# Patient Record
Sex: Female | Born: 1945 | Race: White | Hispanic: Yes | Marital: Single | Smoking: Former smoker
Health system: Southern US, Community
[De-identification: ages and names within clinical notes are randomized; demographics above are authoritative.]

## PROBLEM LIST (undated history)

## (undated) ENCOUNTER — Emergency Department (HOSPITAL_COMMUNITY): Discharge status: Absent without leave | Payer: Medicaid Other

## (undated) DIAGNOSIS — M79606 Pain in leg, unspecified: Secondary | ICD-10-CM

## (undated) DIAGNOSIS — M549 Dorsalgia, unspecified: Secondary | ICD-10-CM

## (undated) DIAGNOSIS — C801 Malignant (primary) neoplasm, unspecified: Secondary | ICD-10-CM

## (undated) DIAGNOSIS — J189 Pneumonia, unspecified organism: Secondary | ICD-10-CM

## (undated) DIAGNOSIS — I959 Hypotension, unspecified: Secondary | ICD-10-CM

## (undated) HISTORY — PX: APPENDECTOMY: SHX54

## (undated) HISTORY — DX: Dorsalgia, unspecified: M54.9

## (undated) HISTORY — PX: OTHER SURGICAL HISTORY: SHX169

---

## 2000-10-17 HISTORY — PX: SMALL INTESTINE SURGERY: SHX150

## 2013-04-02 ENCOUNTER — Encounter (HOSPITAL_COMMUNITY): Payer: Self-pay | Admitting: Emergency Medicine

## 2013-04-02 ENCOUNTER — Emergency Department (INDEPENDENT_AMBULATORY_CARE_PROVIDER_SITE_OTHER)
Admission: EM | Admit: 2013-04-02 | Discharge: 2013-04-02 | Disposition: A | Discharge status: Home / Self Care | Payer: Self-pay | Source: Home / Self Care

## 2013-04-02 DIAGNOSIS — T23099A Burn of unspecified degree of multiple sites of unspecified wrist and hand, initial encounter: Secondary | ICD-10-CM

## 2013-04-02 DIAGNOSIS — T23101A Burn of first degree of right hand, unspecified site, initial encounter: Secondary | ICD-10-CM

## 2013-04-02 HISTORY — DX: Hypotension, unspecified: I95.9

## 2013-04-02 MED ORDER — HYDROCODONE-ACETAMINOPHEN 5-325 MG PO TABS: 1.0000 | ORAL_TABLET | ORAL | Status: DC | PRN

## 2013-04-02 MED ORDER — CLINDAMYCIN HCL 300 MG PO CAPS: 300.0000 mg | ORAL_CAPSULE | Freq: Three times a day (TID) | ORAL | Status: DC

## 2013-04-02 NOTE — ED Provider Notes (Signed)
Medical screening examination/treatment/procedure(s) were performed by resident physician or non-physician practitioner and as supervising physician I was immediately available for consultation/collaboration.   Barkley Bruns MD.   Linna Hoff, MD 04/02/13 [phone]

## 2013-04-02 NOTE — ED Notes (Signed)
Hand burn to right hand on may 11.  Burned with cooking oil.  Has not seen a physician for this.  Pealing skin from left hand and fingers.  Burn included posterior right hand and in between index and middle finger and including thumb

## 2013-04-02 NOTE — ED Provider Notes (Signed)
History     CSN: [id number]  Arrival date & time 04/02/13  1358   None     Chief Complaint  Patient presents with  . Hand Burn    (Consider location/radiation/quality/duration/timing/severity/associated sxs/prior treatment) HPI Comments: And he is 67 year old female is here for evaluation of a burn to the right hand after accidentally spilling hot oil on her hand on 02/24/2013. Is complaining of pain, along with mild swelling and erythema to primarily the dorsum of the hand. Denies systemic symptoms such as fever.   Past Medical History  Diagnosis Date  . Hypotension     History reviewed. No pertinent past surgical history.  No family history on file.  History  Substance Use Topics  . Smoking status: Never Smoker   . Smokeless tobacco: Not on file  . Alcohol Use: No    OB History   Grav Para Term Preterm Abortions TAB SAB Ect Mult Living                  Review of Systems  Constitutional: Negative.  Negative for fever.  Respiratory: Negative.   Cardiovascular: Negative.   Gastrointestinal: Negative.   Musculoskeletal: Positive for joint swelling.  Skin:       As per HPI    Allergies  Review of patient's allergies indicates no known allergies.  Home Medications   Current Outpatient Rx  Name  Route  Sig  Dispense  Refill  . clindamycin (CLEOCIN) 300 MG capsule   Oral   Take 1 capsule (300 mg total) by mouth 3 (three) times daily. X 8 days   24 capsule   0   . HYDROcodone-acetaminophen (NORCO/VICODIN) 5-325 MG per tablet   Oral   Take 1 tablet by mouth every 4 (four) hours as needed for pain.   15 tablet   0     BP 108/50  Pulse 64  Temp(Src) 98.3 F (36.8 C) (Oral)  Resp 16  SpO2 99%  Physical Exam  Constitutional: She is oriented to person, place, and time. She appears well-developed and well-nourished. No distress.  HENT:  Head: Normocephalic and atraumatic.  Eyes: EOM are normal. Pupils are equal, round, and reactive to light.  Neck:  Normal range of motion. Neck supple.  Cardiovascular: Normal rate.   Pulmonary/Chest: Effort normal.  Musculoskeletal: She exhibits edema and tenderness.  Substantial limitation to the flexion of the index and long digits/ IP joints. Mild to moderate swelling persists and marked tenderness.  There is well marginated erythema marking the area of the oil contact running from the 3rd MCP to the base of the thumb and distally. No lymphangitis.   Lymphadenopathy:    She has no cervical adenopathy.  Neurological: She is alert and oriented to person, place, and time. No cranial nerve deficit.  Skin: Skin is warm and dry.  Portions of the area of burn with erythema, no lymphangitis, no open areas, drainage or vessicles. Skin intact.  Psychiatric: She has a normal mood and affect.    ED Course  Procedures (including critical care time)  Labs Reviewed - No data to display No results found.   1. Burn of hand including fingers, right, first degree, initial encounter       MDM  Norco for pain Clindamycin as directed. Call Dr. Merrilee Seashore office tomorrow for appointment.  Dr. Merrilee Seashore office RN contacted for appointment        Hayden Rasmussen, NP 04/02/13 1507

## 2013-04-06 ENCOUNTER — Emergency Department (INDEPENDENT_AMBULATORY_CARE_PROVIDER_SITE_OTHER)
Admission: EM | Admit: 2013-04-06 | Discharge: 2013-04-06 | Disposition: A | Discharge status: Home / Self Care | Payer: Self-pay | Source: Home / Self Care

## 2013-04-06 ENCOUNTER — Encounter (HOSPITAL_COMMUNITY): Payer: Self-pay | Admitting: *Deleted

## 2013-04-06 DIAGNOSIS — M79641 Pain in right hand: Secondary | ICD-10-CM

## 2013-04-06 DIAGNOSIS — M79609 Pain in unspecified limb: Secondary | ICD-10-CM

## 2013-04-06 HISTORY — DX: Malignant (primary) neoplasm, unspecified: C80.1

## 2013-04-06 MED ORDER — OXYCODONE-ACETAMINOPHEN 5-325 MG PO TABS: 1.0000 | ORAL_TABLET | ORAL | Status: DC | PRN

## 2013-04-06 NOTE — ED Notes (Addendum)
Burn to R hand on 5/11.  C/o pain inside her hand over MCP joints of thumb, index and long fingers. Burn has healed but skin still pink and fingers are swollen. Pt. unable to make a fist or hold a cup. Good sensation to tips of fingers. Pt. States she went to the [facility] on [address]. but they did not give her any pain medicine.

## 2013-04-06 NOTE — ED Notes (Signed)
Interpreted for Bianca Rasmussen, NP... Gave instructions to pt in Spanish to f/u w/hand specialist per Onalee Hua... Pt verbalized understanding.

## 2013-04-06 NOTE — ED Provider Notes (Signed)
History     CSN: [id number]  Arrival date & time 04/06/13  1531   None     Chief Complaint  Patient presents with  . Hand Burn    (Consider location/radiation/quality/duration/timing/severity/associated sxs/prior treatment) HPI Comments: This 67 year old female presents with complaints of right hand pain as she did 4 days ago. She accidentally burned her hand on 02/24/2013. She did not seek medical care at that time. She has pain and some contracture of the index and long fingers as well as pain in the distal aspect of the hand. See previous chart for additional description. She presents today for treatment of the hand. The patient states that after some discussion, she is taking her antibiotic that is out of her pain medication. I had obtained an appointment for the with Dr. Merlyn Lot the next day however they affirm that they never received this information. I had written down and handed to the M. personally and advised him of the appointment. There is no change in the appearance of the hand.   Past Medical History  Diagnosis Date  . Hypotension   . Cancer     instestines    Past Surgical History  Procedure Laterality Date  . Small intestine surgery  2002    remove cancer    History reviewed. No pertinent family history.  History  Substance Use Topics  . Smoking status: Never Smoker   . Smokeless tobacco: Not on file  . Alcohol Use: No    OB History   Grav Para Term Preterm Abortions TAB SAB Ect Mult Living                  Review of Systems  All other systems reviewed and are negative.    Allergies  Review of patient's allergies indicates no known allergies.  Home Medications   Current Outpatient Rx  Name  Route  Sig  Dispense  Refill  . clindamycin (CLEOCIN) 300 MG capsule   Oral   Take 1 capsule (300 mg total) by mouth 3 (three) times daily. X 8 days   24 capsule   0   . HYDROcodone-acetaminophen (NORCO/VICODIN) 5-325 MG per tablet   Oral   Take 1  tablet by mouth every 4 (four) hours as needed for pain.   15 tablet   0   . oxyCODONE-acetaminophen (PERCOCET/ROXICET) 5-325 MG per tablet   Oral   Take 1 tablet by mouth every 4 (four) hours as needed for pain.   15 tablet   0     BP 126/77  Pulse 74  Temp(Src) 98.2 F (36.8 C) (Oral)  Resp 17  SpO2 100%  Physical Exam  Nursing note and vitals reviewed. Constitutional: She is oriented to person, place, and time. She appears well-developed and well-nourished. No distress.  Pulmonary/Chest: Effort normal.  Musculoskeletal:  And with lesser edema then 4 days ago. There still tenderness, pain, limited range of motion of the index and long fingers and thumb. No evidence of impaired arterial flow.  Neurological: She is alert and oriented to person, place, and time. She exhibits normal muscle tone.  Skin: Skin is warm and dry.  Psychiatric: She has a normal mood and affect.    ED Course  Procedures (including critical care time)  Labs Reviewed - No data to display No results found.   1. Right hand pain       MDM   Via Ramon,MA the instructions were given to the patient and significant other. They are  to call Dr. Merrilee Seashore office on Monday for an appointment. The name, address and phone number as above. The patient does not recall me giving him information regarding this appointment. I had spent quite a bit of time on the telephone with Dr. Merrilee Seashore office and spoke with the R.N. in the office and eventually obtain an appointment with him the following day as they had a cancellation. I printed on a larger sheet of paper and given it to the interpreter at that time. I told him to make sure they did not miss the appointment. The patient and her significan denies hearing or receiving any of that information., They missed that appointment. [facility] and [facility]  There 4 days ago. Patient is given Percocet for pain #15 and  advised to call Dr. Merrilee Seashore office tomorrow. I told him anything else that we can do for her at this point.        Hayden Rasmussen, NP 04/06/13 1656

## 2013-04-07 NOTE — ED Provider Notes (Signed)
Medical screening examination/treatment/procedure(s) were performed by resident physician or non-physician practitioner and as supervising physician I was immediately available for consultation/collaboration.   KINDL,JAMES DOUGLAS MD.   James D Kindl, MD 04/07/13 1116 

## 2013-04-24 ENCOUNTER — Ambulatory Visit: Payer: Self-pay | Attending: Orthopedic Surgery | Admitting: Occupational Therapy

## 2013-04-24 DIAGNOSIS — M25649 Stiffness of unspecified hand, not elsewhere classified: Secondary | ICD-10-CM | POA: Insufficient documentation

## 2013-04-24 DIAGNOSIS — IMO0001 Reserved for inherently not codable concepts without codable children: Secondary | ICD-10-CM | POA: Insufficient documentation

## 2013-04-24 DIAGNOSIS — R279 Unspecified lack of coordination: Secondary | ICD-10-CM | POA: Insufficient documentation

## 2013-04-24 DIAGNOSIS — M25549 Pain in joints of unspecified hand: Secondary | ICD-10-CM | POA: Insufficient documentation

## 2013-04-29 ENCOUNTER — Ambulatory Visit: Payer: Self-pay | Admitting: Occupational Therapy

## 2013-05-02 ENCOUNTER — Ambulatory Visit: Payer: Self-pay | Admitting: Occupational Therapy

## 2013-05-06 ENCOUNTER — Ambulatory Visit: Payer: Self-pay | Admitting: Occupational Therapy

## 2013-05-08 ENCOUNTER — Encounter: Payer: Self-pay | Admitting: Occupational Therapy

## 2013-05-13 ENCOUNTER — Ambulatory Visit: Payer: Self-pay | Admitting: Occupational Therapy

## 2013-05-15 ENCOUNTER — Encounter: Payer: Self-pay | Admitting: Occupational Therapy

## 2013-05-20 ENCOUNTER — Encounter: Payer: Self-pay | Admitting: Occupational Therapy

## 2013-05-22 ENCOUNTER — Encounter: Payer: Self-pay | Admitting: Occupational Therapy

## 2013-05-24 ENCOUNTER — Ambulatory Visit: Payer: Self-pay | Attending: Family Medicine

## 2013-06-03 ENCOUNTER — Ambulatory Visit: Payer: No Typology Code available for payment source | Attending: Internal Medicine | Admitting: Internal Medicine

## 2013-06-03 VITALS — BP 109/71 | HR 67 | Temp 97.9°F | Resp 16 | Ht <= 58 in | Wt 139.0 lb

## 2013-06-03 DIAGNOSIS — K029 Dental caries, unspecified: Secondary | ICD-10-CM

## 2013-06-03 DIAGNOSIS — Z7189 Other specified counseling: Secondary | ICD-10-CM

## 2013-06-03 DIAGNOSIS — Z7689 Persons encountering health services in other specified circumstances: Secondary | ICD-10-CM

## 2013-06-03 LAB — CMP AND LIVER
ALT: 13 U/L (ref 0–35)
Bilirubin, Direct: 0.1 mg/dL (ref 0.0–0.3)
CO2: 30 mEq/L (ref 19–32)
Calcium: 8.8 mg/dL (ref 8.4–10.5)
Chloride: 106 mEq/L (ref 96–112)
Potassium: 4.2 mEq/L (ref 3.5–5.3)
Sodium: 141 mEq/L (ref 135–145)
Total Protein: 6.7 g/dL (ref 6.0–8.3)

## 2013-06-03 LAB — CBC WITH DIFFERENTIAL/PLATELET
Basophils Absolute: 0 10*3/uL (ref 0.0–0.1)
Basophils Relative: 0 % (ref 0–1)
Eosinophils Absolute: 0.3 10*3/uL (ref 0.0–0.7)
Eosinophils Relative: 3 % (ref 0–5)
Lymphocytes Relative: 28 % (ref 12–46)
MCHC: 32.8 g/dL (ref 30.0–36.0)
MCV: 91.4 fL (ref 78.0–100.0)
Platelets: 264 10*3/uL (ref 150–400)
RDW: 13.8 % (ref 11.5–15.5)
WBC: 8.9 10*3/uL (ref 4.0–10.5)

## 2013-06-03 LAB — LIPID PANEL
Cholesterol: 192 mg/dL (ref 0–200)
LDL Cholesterol: 125 mg/dL — ABNORMAL HIGH (ref 0–99)
Total CHOL/HDL Ratio: 5.8 Ratio
VLDL: 34 mg/dL (ref 0–40)

## 2013-06-03 NOTE — Progress Notes (Signed)
Pt here to establish care for dental problems.states she has not been to dentist in yrs. Pain noted but denies fevers,drainage.vss Spanish interpretor present

## 2013-06-03 NOTE — Progress Notes (Signed)
Patient ID: Bianca Henderson, female   DOB: 03-11-1946, 67 y.o.   MRN: [id number]  CC: To Establish Care  HPI: Patient was seen in the clinic today. She is a 67 years old woman with no significant past medical history. She complains of tooth ache and would like to be referred to a dentist. She denies headache, no chest pain. She has not seen a doctor for years, has not had a mammogram done, no Pap smear done. No colonoscopy. She does not smoke cigarette, she does not drink alcohol.   Allergies  Allergen Reactions  . Septra [Sulfamethoxazole W-Trimethoprim] Itching and Rash   Past Medical History  Diagnosis Date  . Hypotension   . Cancer     instestines   Current Outpatient Prescriptions on File Prior to Visit  Medication Sig Dispense Refill  . clindamycin (CLEOCIN) 300 MG capsule Take 1 capsule (300 mg total) by mouth 3 (three) times daily. X 8 days  24 capsule  0  . HYDROcodone-acetaminophen (NORCO/VICODIN) 5-325 MG per tablet Take 1 tablet by mouth every 4 (four) hours as needed for pain.  15 tablet  0  . oxyCODONE-acetaminophen (PERCOCET/ROXICET) 5-325 MG per tablet Take 1 tablet by mouth every 4 (four) hours as needed for pain.  15 tablet  0   No current facility-administered medications on file prior to visit.   History reviewed. No pertinent family history. History   Social History  . Marital Status: Single    Spouse Name: N/A    Number of Children: N/A  . Years of Education: N/A   Occupational History  . Not on file.   Social History Main Topics  . Smoking status: Never Smoker   . Smokeless tobacco: Not on file  . Alcohol Use: No  . Drug Use: No  . Sexual Activity: Not on file   Other Topics Concern  . Not on file   Social History Narrative  . No narrative on file    Review of Systems: Constitutional: Negative for fever, chills, diaphoresis, activity change, appetite change and fatigue. HENT: Negative for ear pain, nosebleeds, congestion, facial swelling,  rhinorrhea, neck pain, neck stiffness and ear discharge.  Eyes: Negative for pain, discharge, redness, itching and visual disturbance. Respiratory: Negative for cough, choking, chest tightness, shortness of breath, wheezing and stridor.  Cardiovascular: Negative for chest pain, palpitations and leg swelling. Gastrointestinal: Negative for abdominal distention. Genitourinary: Negative for dysuria, urgency, frequency, hematuria, flank pain, decreased urine volume, difficulty urinating and dyspareunia.  Musculoskeletal: Negative for back pain, joint swelling, arthralgias and gait problem. Neurological: Negative for dizziness, tremors, seizures, syncope, facial asymmetry, speech difficulty, weakness, light-headedness, numbness and headaches.  Hematological: Negative for adenopathy. Does not bruise/bleed easily. Psychiatric/Behavioral: Negative for hallucinations, behavioral problems, confusion, dysphoric mood, decreased concentration and agitation.    Objective:   Filed Vitals:   06/03/13 1241  BP: 109/71  Pulse: 67  Temp: 97.9 F (36.6 C)  Resp: 16    Physical Exam: Constitutional: Patient appears well-developed and well-nourished. No distress. HENT: Dental caries.  Eyes: Conjunctivae and EOM are normal. PERRLA, no scleral icterus. Neck: Normal ROM. Neck supple. No JVD. No tracheal deviation. No thyromegaly. CVS: RRR, S1/S2 +, no murmurs, no gallops, no carotid bruit.  Pulmonary: Effort and breath sounds normal, no stridor, rhonchi, wheezes, rales.  Abdominal: Soft. BS +,  no distension, tenderness, rebound or guarding.  Musculoskeletal: Normal range of motion. No edema and no tenderness.  Lymphadenopathy: No lymphadenopathy noted, cervical, inguinal or axillary Neuro: Alert. Normal  reflexes, muscle tone coordination. No cranial nerve deficit. Skin: Skin is warm and dry. No rash noted. Not diaphoretic. No erythema. No pallor. Psychiatric: Normal mood and affect. Behavior, judgment,  thought content normal.  No results found for this basename: WBC, HGB, HCT, MCV, PLT   No results found for this basename: CREATININE, BUN, NA, K, CL, CO2    No results found for this basename: HGBA1C   Lipid Panel  No results found for this basename: chol, trig, hdl, cholhdl, vldl, ldlcalc       Assessment and plan:   Patient Active Problem List   Diagnosis Date Noted  . Encounter to establish care 06/03/2013  . Dental caries 06/03/2013   Ambulatory referral to Dentistry  Labs today: CMP CBCD Urinalysis Lipid Panel HbA1C TSH + FT4 Schedule for Mammogram  Follow up in 4 weeks for annual physical including Pap Smear  Bianca Henderson was given clear instructions to go to ER or return to the clinic if symptoms don't improve, worsen or new problems develop.  Bianca Henderson verbalized understanding.  Bianca Henderson was told to call to get lab results if hasn't heard anything in the next week.    Spanish Interpreter was used to communicate directly with patient for the entire encounter including providing detailed patient instructions.       Jeanann Lewandowsky, MD [facility] [place], [place] [phone]   06/03/2013, 1:13 PM

## 2013-06-04 LAB — URINALYSIS, COMPLETE
Casts: NONE SEEN
Crystals: NONE SEEN
Glucose, UA: NEGATIVE mg/dL
Hgb urine dipstick: NEGATIVE
Ketones, ur: NEGATIVE mg/dL
pH: 8 (ref 5.0–8.0)

## 2013-06-07 ENCOUNTER — Encounter: Payer: Self-pay | Admitting: Internal Medicine

## 2013-06-11 ENCOUNTER — Ambulatory Visit: Payer: No Typology Code available for payment source | Attending: Internal Medicine | Admitting: Internal Medicine

## 2013-06-11 ENCOUNTER — Encounter: Payer: Self-pay | Admitting: Internal Medicine

## 2013-06-11 MED ORDER — DOXYCYCLINE HYCLATE 100 MG PO TABS: 100.0000 mg | ORAL_TABLET | Freq: Two times a day (BID) | ORAL | Status: DC

## 2013-06-11 MED ORDER — TRAMADOL HCL 50 MG PO TABS: 50.0000 mg | ORAL_TABLET | Freq: Three times a day (TID) | ORAL | Status: DC | PRN

## 2013-06-25 ENCOUNTER — Ambulatory Visit: Payer: No Typology Code available for payment source

## 2013-07-11 ENCOUNTER — Ambulatory Visit: Payer: No Typology Code available for payment source | Attending: Internal Medicine | Admitting: Family Medicine

## 2013-07-11 ENCOUNTER — Encounter: Payer: Self-pay | Admitting: Family Medicine

## 2013-07-11 VITALS — BP 119/86 | HR 67 | Temp 97.8°F | Wt 139.8 lb

## 2013-07-11 DIAGNOSIS — G5601 Carpal tunnel syndrome, right upper limb: Secondary | ICD-10-CM

## 2013-07-11 DIAGNOSIS — G5600 Carpal tunnel syndrome, unspecified upper limb: Secondary | ICD-10-CM

## 2013-07-11 NOTE — Patient Instructions (Signed)
Sndrome del tnel carpiano   (Carpal Tunnel Syndrome)   El tnel carpiano es un rea que se encuentra debajo la piel de la palma de su mano. A travs del tnel pasan nervios, vasos sanguneos y tejidos resistentes (tendones). El tnel puede inflamarse (hincharse). Si esto ocurre, un nervio puede quedar pellizcado en la mueca. Esto es lo que causa el sndrome del tnel carpiano.   CUIDADOS EN EL HOGAR    Tome los medicamentos como le indic el mdico.   Si le indicaron un cabestrillo, selo segn las indicaciones. selos a la noche o cuando su mdico le indique.   Haga descansar la mueca de la actividad que le produce dolor.   Ponga hielo en la mueca despus de largos perodos de actividad de la misma.   Ponga el hielo en una bolsa plstica.   Colquese una toalla entre la piel y la bolsa de hielo.   Deje el hielo durante 15 a 20 minutos, 3 a 4 veces por da.   Cumpla con los controles mdicos segn las indicaciones.  SOLICITE AYUDA DE INMEDIATO SI:    Tiene nuevos sntomas que no puede explicar.   Siente un dolor ms intenso y los medicamentos no hacen efecto.  ASEGRESE DE QUE:    Comprende estas instrucciones.   Controlar su enfermedad.   Solicitar ayuda de inmediato si no mejora o si empeora.  Document Released: 09/22/2011 Document Revised: 12/26/2011  [employer] Patient Information 2014 [employer].

## 2013-07-11 NOTE — Progress Notes (Signed)
Patient ID: Bianca Henderson, female   DOB: 27-Jul-1946, 67 y.o.   MRN: [id number]  CC: Pain in right wrist  Interpreter used to communicate directly with patient for entire encounter including providing detailed patient instructions  HPI: The patient is reporting that she's been having problems for the past several months with pain in the right wrist.  She reports that she's had some physical therapy to try and alleviate some of the pain.  It involves fingers one through 3.  The patient reports that she has a tingling and numbness at the tips of the fingers.  She also has pain in the right wrist.  It is exacerbated when she is using her wrist more and using her right hand more frequently with working.  She is right-handed.  She reports that she has also had some weakness in the right hand, mostly because of pain.  The patient reports that the folliculitis that she was treated for a recently has completely resolved.  Allergies  Allergen Reactions  . Septra [Sulfamethoxazole W-Trimethoprim] Itching and Rash   Past Medical History  Diagnosis Date  . Hypotension   . Cancer     instestines   Current Outpatient Prescriptions on File Prior to Visit  Medication Sig Dispense Refill  . clindamycin (CLEOCIN) 300 MG capsule Take 1 capsule (300 mg total) by mouth 3 (three) times daily. X 8 days  24 capsule  0  . doxycycline (VIBRA-TABS) 100 MG tablet Take 1 tablet (100 mg total) by mouth 2 (two) times daily.  20 tablet  0   No current facility-administered medications on file prior to visit.   No family history on file. History   Social History  . Marital Status: Single    Spouse Name: N/A    Number of Children: N/A  . Years of Education: N/A   Occupational History  . Not on file.   Social History Main Topics  . Smoking status: Never Smoker   . Smokeless tobacco: Not on file  . Alcohol Use: No  . Drug Use: No  . Sexual Activity: Not on file   Other Topics Concern  . Not on file   Social  History Narrative  . No narrative on file    Review of Systems  Constitutional: Negative for fever, chills, diaphoresis, activity change, appetite change and fatigue.  HENT: Negative for ear pain, nosebleeds, congestion, facial swelling, rhinorrhea, neck pain, neck stiffness and ear discharge.   Eyes: Negative for pain, discharge, redness, itching and visual disturbance.  Respiratory: Negative for cough, choking, chest tightness, shortness of breath, wheezing and stridor.   Cardiovascular: Negative for chest pain, palpitations and leg swelling.  Gastrointestinal: Negative for abdominal distention.  Genitourinary: Negative for dysuria, urgency, frequency, hematuria, flank pain, decreased urine volume, difficulty urinating and dyspareunia.  Musculoskeletal: Right wrist pain  Neurological: Negative for dizziness, tremors, seizures, syncope, facial asymmetry, speech difficulty, weakness, light-headedness, numbness and headaches.  Hematological: Negative for adenopathy. Does not bruise/bleed easily.  Psychiatric/Behavioral: Negative for hallucinations, behavioral problems, confusion, dysphoric mood, decreased concentration and agitation.    Objective:   Filed Vitals:   07/11/13 1619  BP: 119/86  Pulse: 67  Temp: 97.8 F (36.6 C)    Physical Exam  Constitutional: Appears well-developed and well-nourished. No distress.  HENT: Normocephalic. External right and left ear normal. Oropharynx is clear and moist.  Eyes: Conjunctivae and EOM are normal. PERRLA, no scleral icterus.  Neck: Normal ROM. Neck supple. No JVD. No tracheal deviation. No thyromegaly.  CVS: RRR, S1/S2 +, no murmurs, no gallops, no carotid bruit.  Pulmonary: Effort and breath sounds normal, no stridor, rhonchi, wheezes, rales.  Abdominal: Soft. BS +,  no distension, tenderness, rebound or guarding.  Musculoskeletal: Normal range of motion. No edema, painful right wrist and positive Tinel sign  Lymphadenopathy: No  lymphadenopathy noted, cervical, inguinal. Neuro: Alert. Normal reflexes, muscle tone coordination. No cranial nerve deficit. Skin: Skin is warm and dry. No rash noted. Not diaphoretic. No erythema. No pallor.  Psychiatric: Normal mood and affect. Behavior, judgment, thought content normal.   Lab Results  Component Value Date   WBC 8.9 06/03/2013   HGB 13.5 06/03/2013   HCT 41.2 06/03/2013   MCV 91.4 06/03/2013   PLT 264 06/03/2013   Lab Results  Component Value Date   CREATININE 0.68 06/03/2013   BUN 11 06/03/2013   NA 141 06/03/2013   K 4.2 06/03/2013   CL 106 06/03/2013   CO2 30 06/03/2013    Lab Results  Component Value Date   HGBA1C 5.7* 06/03/2013   Lipid Panel     Component Value Date/Time   CHOL 192 06/03/2013 1317   TRIG 171* 06/03/2013 1317   HDL 33* 06/03/2013 1317   CHOLHDL 5.8 06/03/2013 1317   VLDL 34 06/03/2013 1317   LDLCALC 125* 06/03/2013 1317     Assessment and plan:   Patient Active Problem List   Diagnosis Date Noted  . Encounter to establish care 06/03/2013  . Dental caries 06/03/2013  Carpal tunnel syndrome, right   I encouraged patient to start using a carpal tunnel splint to the right wrist in the evening time.  I like for her to start doing this regularly and followup in one month.  At that time if she continues to have symptoms or if her symptoms worsen I like for her to see sports medicine orthopedics for further evaluation.  The patient verbalized understanding.  Return to clinic in one month for recheck and for complete physical examination  Rodney Langton, MD, CDE, FAAFP [facility] [place], [place]

## 2013-09-05 ENCOUNTER — Ambulatory Visit: Payer: No Typology Code available for payment source | Admitting: Internal Medicine

## 2013-11-04 ENCOUNTER — Ambulatory Visit: Payer: No Typology Code available for payment source | Attending: Internal Medicine | Admitting: Internal Medicine

## 2013-11-04 ENCOUNTER — Encounter: Payer: Self-pay | Admitting: Internal Medicine

## 2013-11-04 VITALS — BP 108/73 | HR 65 | Temp 98.5°F | Resp 16 | Ht 59.0 in | Wt 139.0 lb

## 2013-11-04 DIAGNOSIS — Z01419 Encounter for gynecological examination (general) (routine) without abnormal findings: Secondary | ICD-10-CM | POA: Insufficient documentation

## 2013-11-04 DIAGNOSIS — E785 Hyperlipidemia, unspecified: Secondary | ICD-10-CM

## 2013-11-04 DIAGNOSIS — Z124 Encounter for screening for malignant neoplasm of cervix: Secondary | ICD-10-CM

## 2013-11-04 LAB — POCT GLYCOSYLATED HEMOGLOBIN (HGB A1C): HEMOGLOBIN A1C: 5.4

## 2013-11-04 NOTE — Progress Notes (Signed)
Pt here for pap smear Denies problems or concerns at this time Spanish interpretor present Pt given gown

## 2013-11-04 NOTE — Progress Notes (Signed)
Patient ID: Houston Surges, female   DOB: 14-Jan-1946, 68 y.o.   MRN: [id number] Patient Demographics  Bianca Henderson, is a 68 y.o. female  WUJ:[id number]  [id number]  DOB - 1946-04-17  Chief Complaint  Patient presents with  . Follow-up  . Annual Exam    pap smear        Subjective:   Bianca Henderson is a 68 y.o. female here today for a follow up visit. Patient has no significant complaint. She is here to have a Pap smear done. Awaiting mammogram. She's not on any medication, following exercise and nutritional control for prediabetes and her blood pressure. Blood pressure is controlled. She does not smoke cigarette Patient has No headache, No chest pain, No abdominal pain - No Nausea, No new weakness tingling or numbness, No Cough - SOB.  ALLERGIES: Allergies  Allergen Reactions  . Septra [Sulfamethoxazole-Trimethoprim] Itching and Rash    PAST MEDICAL HISTORY: Past Medical History  Diagnosis Date  . Hypotension   . Cancer     instestines    MEDICATIONS AT HOME: Prior to Admission medications   Medication Sig Start Date End Date Taking? Authorizing Provider  clindamycin (CLEOCIN) 300 MG capsule Take 1 capsule (300 mg total) by mouth 3 (three) times daily. X 8 days 04/02/13   Janne Napoleon, NP  doxycycline (VIBRA-TABS) 100 MG tablet Take 1 tablet (100 mg total) by mouth 2 (two) times daily. 06/11/13   Reyne Dumas, MD     Objective:   Filed Vitals:   11/04/13 1717  BP: 108/73  Pulse: 65  Temp: 98.5 F (36.9 C)  TempSrc: Oral  Resp: 16  Height: 4\' 11"  (1.499 m)  Weight: 139 lb (63.05 kg)  SpO2: 97%    Exam General appearance : Awake, alert, not in any distress. Speech Clear. Not toxic looking HEENT: Atraumatic and Normocephalic, pupils equally reactive to light and accomodation Neck: supple, no JVD. No cervical lymphadenopathy.  Chest:Good air entry bilaterally, no added sounds  CVS: S1 S2 regular, no murmurs.  Abdomen: Bowel sounds present, Non tender and not distended  with no gaurding, rigidity or rebound. Extremities: B/L Lower Ext shows no edema, both legs are warm to touch Neurology: Awake alert, and oriented X 3, CN II-XII intact, Non focal Skin:No Rash Wounds:N/A Pelvic examination: Normal female external genitalia Central cervix, negative cervical motion tenderness, minimal bleeding after Pap smear  Data Review   CBC No results found for this basename: WBC, HGB, HCT, PLT, MCV, MCH, MCHC, RDW, NEUTRABS, LYMPHSABS, MONOABS, EOSABS, BASOSABS, BANDABS, BANDSABD,  in the last 168 hours  Chemistries   No results found for this basename: NA, K, CL, CO2, GLUCOSE, BUN, CREATININE, GFRCGP, CALCIUM, MG, AST, ALT, ALKPHOS, BILITOT,  in the last 168 hours ------------------------------------------------------------------------------------------------------------------ No results found for this basename: HGBA1C,  in the last 72 hours ------------------------------------------------------------------------------------------------------------------ No results found for this basename: CHOL, HDL, LDLCALC, TRIG, CHOLHDL, LDLDIRECT,  in the last 72 hours ------------------------------------------------------------------------------------------------------------------ No results found for this basename: TSH, T4TOTAL, FREET3, T3FREE, THYROIDAB,  in the last 72 hours ------------------------------------------------------------------------------------------------------------------ No results found for this basename: VITAMINB12, FOLATE, FERRITIN, TIBC, IRON, RETICCTPCT,  in the last 72 hours  Coagulation profile  No results found for this basename: INR, PROTIME,  in the last 168 hours    Assessment & Plan   1. Pap smear for cervical cancer screening Pap smear done and sent for cytology - Cytology - PAP - MM Digital Screening; Future  2. Dyslipidemia  - Lipid Panel - HgB A1c  Patient was Counseled extensively about nutrition and exercise  Follow up in 3  months or when necessary  Interpreter was used to communicate directly with patient for the entire encounter including providing detailed patient instructions.   The patient was given clear instructions to go to ER or return to medical center if symptoms don't improve, worsen or new problems develop. The patient verbalized understanding. The patient was told to call to get lab results if they haven't heard anything in the next week.    Angelica Chessman, MD, [place], [place], [place] and [place], [place]   11/04/2013, 5:45 PM

## 2013-11-05 ENCOUNTER — Telehealth: Payer: Self-pay | Admitting: *Deleted

## 2013-11-05 LAB — LIPID PANEL
CHOL/HDL RATIO: 4.7 ratio
Cholesterol: 194 mg/dL (ref 0–200)
HDL: 41 mg/dL (ref >39)
LDL CALC: 118 mg/dL — AB (ref 0–99)
Triglycerides: 177 mg/dL — ABNORMAL HIGH (ref <150)
VLDL: 35 mg/dL (ref 0–40)

## 2013-11-05 NOTE — Telephone Encounter (Signed)
Left message 1st attempt.  

## 2013-11-05 NOTE — Telephone Encounter (Signed)
Message copied by Joan Mayans on Tue Nov 05, 2013  2:44 PM ------      Message from: Tresa Garter      Created: Tue Nov 05, 2013  9:55 AM       Please call to inform patient that her cholesterol level is slightly better than last time at her hemoglobin A1c is normal meaning that she is not diabetic. We encouraged her to continue low-fat diet and regular physical exercise. We are awaiting the Pap smear result ------

## 2013-11-11 ENCOUNTER — Telehealth: Payer: Self-pay | Admitting: *Deleted

## 2013-11-11 NOTE — Telephone Encounter (Signed)
I spoke to the pt and informed her that the PAP results came back normal.

## 2013-11-11 NOTE — Telephone Encounter (Signed)
Message copied by Joan Mayans on Mon Nov 11, 2013 11:27 AM ------      Message from: Angelica Chessman E      Created: Wed Nov 06, 2013  2:47 PM       Please inform patient that her Pap smear result is normal ------

## 2013-11-18 ENCOUNTER — Emergency Department (HOSPITAL_COMMUNITY)
Admission: EM | Admit: 2013-11-18 | Discharge: 2013-11-18 | Disposition: A | Discharge status: Home / Self Care | Payer: Medicaid Other | Attending: Emergency Medicine | Admitting: Emergency Medicine

## 2013-11-18 ENCOUNTER — Emergency Department (HOSPITAL_COMMUNITY): Payer: Medicaid Other

## 2013-11-18 ENCOUNTER — Encounter (HOSPITAL_COMMUNITY): Payer: Self-pay | Admitting: Emergency Medicine

## 2013-11-18 DIAGNOSIS — Z8679 Personal history of other diseases of the circulatory system: Secondary | ICD-10-CM | POA: Insufficient documentation

## 2013-11-18 DIAGNOSIS — Z8589 Personal history of malignant neoplasm of other organs and systems: Secondary | ICD-10-CM | POA: Insufficient documentation

## 2013-11-18 DIAGNOSIS — R109 Unspecified abdominal pain: Secondary | ICD-10-CM | POA: Insufficient documentation

## 2013-11-18 DIAGNOSIS — M25551 Pain in right hip: Secondary | ICD-10-CM

## 2013-11-18 DIAGNOSIS — K8020 Calculus of gallbladder without cholecystitis without obstruction: Secondary | ICD-10-CM

## 2013-11-18 LAB — COMPREHENSIVE METABOLIC PANEL
ALT: 15 U/L (ref 0–35)
AST: 21 U/L (ref 0–37)
Albumin: 3.5 g/dL (ref 3.5–5.2)
Alkaline Phosphatase: 75 U/L (ref 39–117)
BUN: 17 mg/dL (ref 6–23)
CALCIUM: 8.5 mg/dL (ref 8.4–10.5)
CHLORIDE: 103 meq/L (ref 96–112)
CO2: 23 meq/L (ref 19–32)
Creatinine, Ser: 0.8 mg/dL (ref 0.50–1.10)
GFR calc Af Amer: 87 mL/min — ABNORMAL LOW (ref >90)
GFR, EST NON AFRICAN AMERICAN: 75 mL/min — AB (ref >90)
Glucose, Bld: 89 mg/dL (ref 70–99)
Potassium: 4.1 mEq/L (ref 3.7–5.3)
SODIUM: 141 meq/L (ref 137–147)
Total Bilirubin: 0.3 mg/dL (ref 0.3–1.2)
Total Protein: 7.4 g/dL (ref 6.0–8.3)

## 2013-11-18 LAB — URINALYSIS, ROUTINE W REFLEX MICROSCOPIC
Glucose, UA: NEGATIVE mg/dL
Ketones, ur: 15 mg/dL — AB
Nitrite: NEGATIVE
Protein, ur: NEGATIVE mg/dL
SPECIFIC GRAVITY, URINE: 1.035 — AB (ref 1.005–1.030)
UROBILINOGEN UA: 1 mg/dL (ref 0.0–1.0)
pH: 5.5 (ref 5.0–8.0)

## 2013-11-18 LAB — CBC WITH DIFFERENTIAL/PLATELET
BASOS ABS: 0 10*3/uL (ref 0.0–0.1)
Basophils Relative: 0 % (ref 0–1)
EOS PCT: 1 % (ref 0–5)
Eosinophils Absolute: 0.1 10*3/uL (ref 0.0–0.7)
HCT: 39.6 % (ref 36.0–46.0)
Hemoglobin: 13.9 g/dL (ref 12.0–15.0)
LYMPHS PCT: 29 % (ref 12–46)
Lymphs Abs: 2.3 10*3/uL (ref 0.7–4.0)
MCH: 31.6 pg (ref 26.0–34.0)
MCHC: 35.1 g/dL (ref 30.0–36.0)
MCV: 90 fL (ref 78.0–100.0)
Monocytes Absolute: 0.9 10*3/uL (ref 0.1–1.0)
Monocytes Relative: 12 % (ref 3–12)
NEUTROS ABS: 4.4 10*3/uL (ref 1.7–7.7)
NEUTROS PCT: 58 % (ref 43–77)
PLATELETS: 213 10*3/uL (ref 150–400)
RBC: 4.4 MIL/uL (ref 3.87–5.11)
RDW: 13.2 % (ref 11.5–15.5)
WBC: 7.7 10*3/uL (ref 4.0–10.5)

## 2013-11-18 LAB — URINE MICROSCOPIC-ADD ON

## 2013-11-18 MED ORDER — OXYCODONE-ACETAMINOPHEN 5-325 MG PO TABS: 2.0000 | ORAL_TABLET | ORAL | Status: DC | PRN

## 2013-11-18 MED ORDER — IBUPROFEN 400 MG PO TABS: 400.0000 mg | ORAL_TABLET | Freq: Four times a day (QID) | ORAL | Status: DC | PRN

## 2013-11-18 NOTE — ED Provider Notes (Signed)
CSN: [id number]     Arrival date & time 11/18/13  1415 History   First MD Initiated Contact with Patient 11/18/13 1644     Chief Complaint  Patient presents with  . Abdominal Pain   (Consider location/radiation/quality/duration/timing/severity/associated sxs/prior Treatment) Patient is a 68 y.o. female presenting with abdominal pain. The history is provided by the patient and a relative.  Abdominal Pain  patient here complaining of intermittent sharp right flank and inguinal pain x3 months. Episodes of this last for seconds to minutes. They are not worse with movement. No associated hematuria or dysuria. No fever or chills. No vomiting or diarrhea. No treatment used prior to arrival. Symptoms are not worse with walking. No radicular component to them. No rashes to the skin. She is currently pain-free.  Past Medical History  Diagnosis Date  . Hypotension   . Cancer     instestines   Past Surgical History  Procedure Laterality Date  . Small intestine surgery  2002    remove cancer  . Appendectomy     History reviewed. No pertinent family history. History  Substance Use Topics  . Smoking status: Never Smoker   . Smokeless tobacco: Not on file  . Alcohol Use: No   OB History   Grav Para Term Preterm Abortions TAB SAB Ect Mult Living                 Review of Systems  Gastrointestinal: Positive for abdominal pain.  All other systems reviewed and are negative.    Allergies  Septra  Home Medications   Current Outpatient Rx  Name  Route  Sig  Dispense  Refill  . acetaminophen (TYLENOL) 500 MG tablet   Oral   Take 1,000 mg by mouth every 8 (eight) hours as needed for moderate pain.          BP 114/64  Pulse 66  Temp(Src) 97.6 F (36.4 C) (Oral)  Resp 20  Ht 4\' 11"  (1.499 m)  Wt 138 lb (62.596 kg)  BMI 27.86 kg/m2  SpO2 97% Physical Exam  Nursing note and vitals reviewed. Constitutional: She is oriented to person, place, and time. She appears well-developed and  well-nourished.  Non-toxic appearance. No distress.  HENT:  Head: Normocephalic and atraumatic.  Eyes: Conjunctivae, EOM and lids are normal. Pupils are equal, round, and reactive to light.  Neck: Normal range of motion. Neck supple. No tracheal deviation present. No mass present.  Cardiovascular: Normal rate, regular rhythm and normal heart sounds.  Exam reveals no gallop.   No murmur heard. Pulmonary/Chest: Effort normal and breath sounds normal. No stridor. No respiratory distress. She has no decreased breath sounds. She has no wheezes. She has no rhonchi. She has no rales.  Abdominal: Soft. Normal appearance and bowel sounds are normal. She exhibits no distension. There is no tenderness. There is no rigidity, no rebound, no guarding and no CVA tenderness. No hernia. Hernia confirmed negative in the ventral area.    Musculoskeletal: Normal range of motion. She exhibits no edema and no tenderness.  Neurological: She is alert and oriented to person, place, and time. She has normal strength. No cranial nerve deficit or sensory deficit. GCS eye subscore is 4. GCS verbal subscore is 5. GCS motor subscore is 6.  Skin: Skin is warm and dry. No abrasion and no rash noted.  Psychiatric: She has a normal mood and affect. Her speech is normal and behavior is normal.    ED Course  Procedures (including critical  care time) Labs Review Labs Reviewed  COMPREHENSIVE METABOLIC PANEL - Abnormal; Notable for the following:    GFR calc non Af Amer 75 (*)    GFR calc Af Amer 87 (*)    All other components within normal limits  CBC WITH DIFFERENTIAL  URINALYSIS, ROUTINE W REFLEX MICROSCOPIC   Imaging Review No results found.  EKG Interpretation   None       MDM  No diagnosis found. Patient's x-ray findings reviewed with her and she has no evidence of cholecystitis at this time. She has no right upper quadrant pain. Her pain is pinpoint tender at her right inguinal canal without evidence of  hernia. Patient given strict return precautions concerning her cholelithiasis and she understands this. Have offered her an ultrasound today which he has deferred. Followup given    Leota Jacobsen, MD 11/18/13 Dorthula Perfect

## 2013-11-18 NOTE — ED Notes (Signed)
Pt states that when she is walking she gets a pinch in the right hip. Almost like a pin feeling and it catches.

## 2013-11-18 NOTE — Discharge Instructions (Signed)
Return here at once for fever, vomiting, severe abdominal pain.  Colelitiasis    (Cholelithiasis) La colelitiasis (tambin llamada clculos en la vescula) es una enfermedad en la que se forman piedras en la vescula. La vescula es un rgano que almacena la bilis que se forma en el hgado y que ayuda a Licensed conveyancer. Los clculos comienzan como pequeos cristales y lentamente se transforman en piedras. El dolor en la vescula ocurre cuando se producen espasmos y los clculos obstruyen el conducto. El dolor tambin se produce cuando una piedra sale por el conducto.  FACTORES DE RIESGO  Ser mujer.   Tener embarazos mltiples. Algunas veces los mdicos aconsejan extirpar los clculos biliares antes de futuros embarazos.   Ser obeso.  Dietas que incluyan comidas fritas y grasas.   Ser mayor de 14 aos y el aumento de la edad.   El uso prolongado de medicamentos que contengan hormonas femeninas.   Tener diabetes mellitus.   Prdida rpida de peso.   Historia familiar de clculos (herencia).  SNTOMAS  Nuseas.   Vmitos.  Dolor abdominal.   Piel amarilla (ictericia)   Dolor sbito. Puede persistir desde algunos minutos hasta algunas horas.  Cristy Hilts.   Sensibilidad al tacto. En algunos casos, cuando los clculos biliares no se mueven hacia el conducto biliar, las personas no sienten dolor ni presentan sntomas. Estos se denominan clculos "silenciosos".  TRATAMIENTO Los clculos silenciosos no requieren Clinical research associate. En los [employer], podr ser [place]. Las opciones de tratamiento son:  Dwaine Gale para extirpar la vescula. Es el tratamiento ms frecuente.  Medicamentos. No siempre dan resultado y pueden demorar entre 6 y 19 meses o ms en Chief of Staff.  Tratamiento con ondas de choque (litotricia biliar extracorporal). En este tratamiento, una mquina de ultrasonido enva ondas de choque a la vescula para destruir los clculos en  pequeos fragmentos que luego podrn pasar a los intestinos o ser disueltas con medicamentos. INSTRUCCIONES PARA EL CUIDADO EN EL HOGAR   Slo tome medicamentos de venta libre o recetados para Glass blower/designer, Health and safety inspector o bajar la fiebre, segn las indicaciones de su mdico.   Siga una dieta baja en grasas hasta que su mdico lo vea nuevamente. Las grasas hacen que la vescula se Location manager, lo que puede Orthoptist.   Concurra a las consultas de control con su mdico segn las indicaciones. Los ataques casi siempre son recurrentes y generalmente habr que someterse a una ciruga como [place].  SOLICITE ATENCIN MDICA DE INMEDIATO SI:   El dolor aumenta y no puede controlarlo con los medicamentos.   Tiene fiebre o sntomas persistentes durante ms de 2 - 3 das.   Tiene fiebre y los sntomas empeoran repentinamente.   Tiene nuseas o vmitos persistentes.  ASEGRESE DE QUE:   Comprende estas instrucciones.  Controlar su afeccin.  Recibir ayuda de inmediato si no mejora o si empeora. Document Released: 07/20/2006 Document Revised: 06/05/2013 [facility] Patient Information 2014 [place], [place].

## 2013-11-18 NOTE — ED Notes (Signed)
Pt given d/c instructions and verbalized understanding.  

## 2013-11-18 NOTE — ED Notes (Signed)
Pt reports pain in right flank and groin area, onset today around 1000, sudden and shooting pains. Denies other symptoms.

## 2013-11-18 NOTE — ED Notes (Signed)
Patient was informed of their wait time and reassessed. Patient was in no acute distress.

## 2013-11-19 LAB — URINE CULTURE
Colony Count: NO GROWTH
Culture: NO GROWTH

## 2013-11-24 ENCOUNTER — Emergency Department (HOSPITAL_COMMUNITY): Payer: Medicaid Other

## 2013-11-24 ENCOUNTER — Inpatient Hospital Stay (HOSPITAL_COMMUNITY)
Admission: EM | Admit: 2013-11-24 | Discharge: 2013-11-27 | DRG: 194 | Disposition: A | Discharge status: Home / Self Care | Payer: Medicaid Other | Attending: Internal Medicine | Admitting: Internal Medicine

## 2013-11-24 ENCOUNTER — Encounter (HOSPITAL_COMMUNITY): Payer: Self-pay | Admitting: Emergency Medicine

## 2013-11-24 DIAGNOSIS — Z888 Allergy status to other drugs, medicaments and biological substances status: Secondary | ICD-10-CM

## 2013-11-24 DIAGNOSIS — E785 Hyperlipidemia, unspecified: Secondary | ICD-10-CM

## 2013-11-24 DIAGNOSIS — Z8509 Personal history of malignant neoplasm of other digestive organs: Secondary | ICD-10-CM

## 2013-11-24 DIAGNOSIS — D72829 Elevated white blood cell count, unspecified: Secondary | ICD-10-CM

## 2013-11-24 DIAGNOSIS — G5600 Carpal tunnel syndrome, unspecified upper limb: Secondary | ICD-10-CM

## 2013-11-24 DIAGNOSIS — Z7689 Persons encountering health services in other specified circumstances: Secondary | ICD-10-CM

## 2013-11-24 DIAGNOSIS — E871 Hypo-osmolality and hyponatremia: Secondary | ICD-10-CM

## 2013-11-24 DIAGNOSIS — Z9089 Acquired absence of other organs: Secondary | ICD-10-CM

## 2013-11-24 DIAGNOSIS — K029 Dental caries, unspecified: Secondary | ICD-10-CM

## 2013-11-24 DIAGNOSIS — Z124 Encounter for screening for malignant neoplasm of cervix: Secondary | ICD-10-CM

## 2013-11-24 DIAGNOSIS — J189 Pneumonia, unspecified organism: Principal | ICD-10-CM

## 2013-11-24 HISTORY — DX: Pneumonia, unspecified organism: J18.9

## 2013-11-24 HISTORY — DX: Pain in leg, unspecified: M79.606

## 2013-11-24 LAB — BASIC METABOLIC PANEL
BUN: 14 mg/dL (ref 6–23)
CALCIUM: 8.7 mg/dL (ref 8.4–10.5)
CO2: 21 meq/L (ref 19–32)
CREATININE: 0.78 mg/dL (ref 0.50–1.10)
Chloride: 97 mEq/L (ref 96–112)
GFR calc Af Amer: >90 mL/min (ref >90)
GFR calc non Af Amer: 85 mL/min — ABNORMAL LOW (ref >90)
GLUCOSE: 97 mg/dL (ref 70–99)
Potassium: 4.2 mEq/L (ref 3.7–5.3)
Sodium: 131 mEq/L — ABNORMAL LOW (ref 137–147)

## 2013-11-24 LAB — CBC
HEMATOCRIT: 38.7 % (ref 36.0–46.0)
HEMOGLOBIN: 13.4 g/dL (ref 12.0–15.0)
MCH: 31.3 pg (ref 26.0–34.0)
MCHC: 34.6 g/dL (ref 30.0–36.0)
MCV: 90.4 fL (ref 78.0–100.0)
Platelets: 380 10*3/uL (ref 150–400)
RBC: 4.28 MIL/uL (ref 3.87–5.11)
RDW: 13.2 % (ref 11.5–15.5)
WBC: 21.6 10*3/uL — ABNORMAL HIGH (ref 4.0–10.5)

## 2013-11-24 LAB — CG4 I-STAT (LACTIC ACID): Lactic Acid, Venous: 1.13 mmol/L (ref 0.5–2.2)

## 2013-11-24 MED ORDER — ACETAMINOPHEN 650 MG RE SUPP: 650.0000 mg | Freq: Four times a day (QID) | RECTAL | Status: DC | PRN

## 2013-11-24 MED ORDER — ALBUTEROL SULFATE (2.5 MG/3ML) 0.083% IN NEBU: 2.5000 mg | INHALATION_SOLUTION | RESPIRATORY_TRACT | Status: DC | PRN

## 2013-11-24 MED ORDER — AZITHROMYCIN 500 MG PO TABS
500.0000 mg | ORAL_TABLET | Freq: Every day | ORAL | Status: DC
  Administered 2013-11-25 – 2013-11-27 (×3): 500 mg via ORAL
  Filled 2013-11-24 (×3): qty 1

## 2013-11-24 MED ORDER — ACETAMINOPHEN 325 MG PO TABS
650.0000 mg | ORAL_TABLET | Freq: Four times a day (QID) | ORAL | Status: DC | PRN
  Administered 2013-11-26: 650 mg via ORAL
  Filled 2013-11-24: qty 2

## 2013-11-24 MED ORDER — IPRATROPIUM-ALBUTEROL 0.5-2.5 (3) MG/3ML IN SOLN
3.0000 mL | Freq: Once | RESPIRATORY_TRACT | Status: AC
  Administered 2013-11-24: 3 mL via RESPIRATORY_TRACT
  Filled 2013-11-24: qty 3

## 2013-11-24 MED ORDER — GUAIFENESIN-DM 100-10 MG/5ML PO SYRP
5.0000 mL | ORAL_SOLUTION | ORAL | Status: DC | PRN
  Administered 2013-11-24 – 2013-11-26 (×4): 5 mL via ORAL
  Filled 2013-11-24 (×3): qty 5

## 2013-11-24 MED ORDER — DEXTROSE 5 % IV SOLN
1.0000 g | INTRAVENOUS | Status: DC
  Administered 2013-11-25 – 2013-11-26 (×2): 1 g via INTRAVENOUS
  Filled 2013-11-24 (×3): qty 10

## 2013-11-24 MED ORDER — ONDANSETRON HCL 4 MG PO TABS: 4.0000 mg | ORAL_TABLET | Freq: Four times a day (QID) | ORAL | Status: DC | PRN

## 2013-11-24 MED ORDER — ENOXAPARIN SODIUM 40 MG/0.4ML ~~LOC~~ SOLN
40.0000 mg | SUBCUTANEOUS | Status: DC
  Administered 2013-11-24 – 2013-11-26 (×3): 40 mg via SUBCUTANEOUS
  Filled 2013-11-24 (×4): qty 0.4

## 2013-11-24 MED ORDER — DEXTROSE 5 % IV SOLN
500.0000 mg | Freq: Once | INTRAVENOUS | Status: AC
  Administered 2013-11-24: 500 mg via INTRAVENOUS

## 2013-11-24 MED ORDER — SODIUM CHLORIDE 0.9 % IV SOLN
INTRAVENOUS | Status: DC
  Administered 2013-11-25 (×2): via INTRAVENOUS
  Administered 2013-11-25: 1 mL via INTRAVENOUS
  Administered 2013-11-26: 01:00:00 via INTRAVENOUS
  Administered 2013-11-26: 1000 mL via INTRAVENOUS

## 2013-11-24 MED ORDER — DEXTROSE 5 % IV SOLN
1.0000 g | Freq: Once | INTRAVENOUS | Status: AC
  Administered 2013-11-24: 1 g via INTRAVENOUS
  Filled 2013-11-24: qty 10

## 2013-11-24 MED ORDER — SODIUM CHLORIDE 0.9 % IV BOLUS (SEPSIS): 1000.0000 mL | Freq: Once | INTRAVENOUS | Status: DC

## 2013-11-24 MED ORDER — SODIUM CHLORIDE 0.9 % IV BOLUS (SEPSIS)
1000.0000 mL | Freq: Once | INTRAVENOUS | Status: AC
  Administered 2013-11-24: 1000 mL via INTRAVENOUS

## 2013-11-24 MED ORDER — ONDANSETRON HCL 4 MG/2ML IJ SOLN: 4.0000 mg | Freq: Four times a day (QID) | INTRAMUSCULAR | Status: DC | PRN

## 2013-11-24 NOTE — ED Notes (Signed)
Patient transported to X-ray 

## 2013-11-24 NOTE — H&P (Signed)
[facility] History and Physical  Bianca Henderson ZOX:[id number] DOB: 1946/09/16 DOA: 11/24/2013   PCP: No PCP Per Patient   Chief Complaint: fever/cough  HPI:  68 year old female with a history of GI cancer without any other chronic medical conditions presents with one-week history of subjective fevers and chills associated with coughing with yellow-green sputum and whole-body myalgias and arthralgias. Information is obtained from interpreter at the bedside. The patient denies any chest discomfort, shortness of breath, hemoptysis, vomiting, diarrhea, dysuria, hematuria. There is no hematochezia or melena. She does complain of some nausea and sore throat. The patient was in ED on 11/18/2013 with right lower quadrant Abdominal pain which has since resolved. CT of the abdomen and pelvis on 11/18/2013 was negative for any acute findings. The abdominal pain has since improved. The patient complains of a bifrontal headache without any visual disturbance or focal extremity weakness. She denies any sick contacts. The patient does not smoke, drink alcohol, or use any illegal drugs. She has not had any recent travels.  In the ED, the patient was noted to have WBC 21.6, temperature 9.53F, oxygen saturation 94% on room air. She was given 1 L normal saline and started on ceftriaxone and azithromycin. Sodium was 131 with bicarbonate 21. Lactic acid was 1.13. Assessment/Plan: Community acquired pneumonia -Continue ceftriaxone and azithromycin -Urine Legionella antigen -Urine Streptococcus pneumoniae antigen -Influenza PCR -Respiratory viral panel -Pulmonary hygiene including flutter valve -Blood cultures have been obtained in the ED -Albuterol when necessary shortness of breath, wheezing Hyponatremia -Likely volume depletion -Continue IV fluids  leukocytosis  -Will also check UA with reflex to urine culture Dental caries -will need outpt followup       Past Medical History  Diagnosis Date  .  Hypotension   . Cancer     instestines   Past Surgical History  Procedure Laterality Date  . Small intestine surgery  2002    remove cancer  . Appendectomy     Social History:  reports that she has never smoked. She does not have any smokeless tobacco history on file. She reports that she does not drink alcohol or use illicit drugs.   History reviewed. No pertinent family history.   Allergies  Allergen Reactions  . Septra [Sulfamethoxazole-Trimethoprim] Itching and Rash      Prior to Admission medications   Medication Sig Start Date End Date Taking? Authorizing Provider  acetaminophen (TYLENOL) 500 MG tablet Take 1,000 mg by mouth every 8 (eight) hours as needed for moderate pain.    Historical Provider, MD  ibuprofen (ADVIL,MOTRIN) 400 MG tablet Take 1 tablet (400 mg total) by mouth every 6 (six) hours as needed. 11/18/13   Leota Jacobsen, MD  oxyCODONE-acetaminophen (PERCOCET/ROXICET) 5-325 MG per tablet Take 2 tablets by mouth every 4 (four) hours as needed for severe pain. 11/18/13   Leota Jacobsen, MD    Review of Systems:  Constitutional:  No weight loss, night sweats, .  Head&Eyes: No headache.  No vision loss.  No eye pain or scotoma ENT:  No Difficulty swallowing, No ear ache, post nasal drip,  Cardio-vascular:  No chest pain, Orthopnea, PND, swelling in lower extremities,  dizziness, palpitations  GI:  No   nausea, vomiting, diarrhea, loss of appetite, hematochezia, melena, heartburn, indigestion, Resp:  No shortness of breath with exertion or at rest. No cough. No coughing up of blood .No wheezing.No chest wall deformity  Skin:  no rash or lesions.  GU:  no dysuria, change in color of urine,  no urgency or frequency. No flank pain.  Musculoskeletal:  No joint pain or swelling. No decreased range of motion.  patient complains of bilateral thoracic and lumbar back pain  Psych:  No change in mood or affect. No depression or anxiety. Neurologic: No headache,  no dysesthesia, no focal weakness, no vision loss. No syncope  Physical Exam: Filed Vitals:   11/24/13 1418 11/24/13 1455 11/24/13 1555 11/24/13 1600  BP: 95/58 101/62 95/60 103/58  Pulse: 113 83 80 79  Temp:      Resp: 16  20 36  SpO2: 93% 93% 95% 95%   General:  A&O x 3, NAD, nontoxic, pleasant/cooperative Head/Eye: No conjunctival hemorrhage, no icterus, [place]/AT, No nystagmus ENT:  No icterus,  No thrush, poor dentition, no pharyngeal exudate Neck:  No masses, no lymphadenpathy, no bruits CV:  RRR, no rub, no gallop, no S3 Lung:  scattered rhonchi with expiratory wheezes. Good air movement.  Abdomen: soft/epigastric discomfort without any rebound tenderness, +BS, nondistended, no peritoneal signs Ext: No cyanosis, No rashes, No petechiae, No lymphangitis, 1+ edema   Labs on Admission:  Basic Metabolic Panel:  Recent Labs Lab 11/18/13 1436 11/24/13 1421  NA 141 131*  K 4.1 4.2  CL 103 97  CO2 23 21  GLUCOSE 89 97  BUN 17 14  CREATININE 0.80 0.78  CALCIUM 8.5 8.7   Liver Function Tests:  Recent Labs Lab 11/18/13 1436  AST 21  ALT 15  ALKPHOS 75  BILITOT 0.3  PROT 7.4  ALBUMIN 3.5   No results found for this basename: LIPASE, AMYLASE,  in the last 168 hours No results found for this basename: AMMONIA,  in the last 168 hours CBC:  Recent Labs Lab 11/18/13 1436 11/24/13 1421  WBC 7.7 21.6*  NEUTROABS 4.4  --   HGB 13.9 13.4  HCT 39.6 38.7  MCV 90.0 90.4  PLT 213 380   Cardiac Enzymes: No results found for this basename: CKTOTAL, CKMB, CKMBINDEX, TROPONINI,  in the last 168 hours BNP: No components found with this basename: POCBNP,  CBG: No results found for this basename: GLUCAP,  in the last 168 hours  Radiological Exams on Admission: Dg Chest 2 View  11/24/2013   CLINICAL DATA:  Cough.  Fever.  Sore throat.  EXAM: CHEST  2 VIEW  COMPARISON:  None.  FINDINGS: Lateral view degraded by patient arm position. Midline trachea. Normal heart size and  mediastinal contours. No pleural effusion or pneumothorax. Bibasilar reticular nodular opacities/interstitial thickening. No lobar consolidation.  IMPRESSION: Bibasilar reticular nodular opacities, of indeterminate acuity. Although these can be seen with chronic bronchitis/ asthma, acute atypical bacterial or viral infection is favored.   Electronically Signed   By: Abigail Miyamoto M.D.   On: 11/24/2013 15:55        Time spent:60 minutes Code Status:   FULL Family Communication:   Family friend at bedside   Bianca Zucker, DO  [facility] Pager [phone]  If 7PM-7AM, please contact night-coverage [email or web address] Password [email or web address] 11/24/2013, 5:02 PM

## 2013-11-24 NOTE — ED Provider Notes (Signed)
CSN: [id number]     Arrival date & time 11/24/13  1329 History   First MD Initiated Contact with Patient 11/24/13 1458     Chief Complaint  Patient presents with  . URI   (Consider location/radiation/quality/duration/timing/severity/associated sxs/prior Treatment) HPI Comments: 68 yo female presents with 1 wk h/o URI symptoms.  Cough, congestion, subjective fever.    Pt was recently seen on 11/18/13 for abd pain - concern for hernia, labwork negative, ultrasound deferred.  Follow up given.     Patient is a 68 y.o. female presenting with URI. The history is provided by the patient and a relative. The history is limited by a language barrier. Language interpreter used: friend provided translation quite well.  URI Presenting symptoms: congestion, cough, fever and rhinorrhea   Presenting symptoms: no ear pain   Cough:    Cough characteristics:  Non-productive   Sputum characteristics:  Yellow   Severity:  Moderate   Onset quality:  Gradual   Duration:  1 week Severity:  Mild Onset quality:  Gradual Duration:  1 week Timing:  Intermittent Progression:  Waxing and waning Chronicity:  New Relieved by: OTC meds. Worsened by:  Nothing tried Associated symptoms: sinus pain   Associated symptoms: no arthralgias, no headaches, no myalgias, no neck pain, no sneezing, no swollen glands and no wheezing   Risk factors: being elderly   Risk factors: no chronic kidney disease, no chronic respiratory disease, no diabetes mellitus, no recent illness, no recent travel and no sick contacts     Past Medical History  Diagnosis Date  . Hypotension   . Cancer     instestines   Past Surgical History  Procedure Laterality Date  . Small intestine surgery  2002    remove cancer  . Appendectomy     History reviewed. No pertinent family history. History  Substance Use Topics  . Smoking status: Never Smoker   . Smokeless tobacco: Not on file  . Alcohol Use: No   OB History   Grav Para Term  Preterm Abortions TAB SAB Ect Mult Living                 Review of Systems  Constitutional: Positive for fever.  HENT: Positive for congestion and rhinorrhea. Negative for ear pain, facial swelling, hearing loss, mouth sores, nosebleeds and sneezing.   Eyes: Negative.   Respiratory: Positive for cough and shortness of breath. Negative for apnea, choking, chest tightness, wheezing and stridor.   Cardiovascular: Negative.   Gastrointestinal: Positive for abdominal pain.       With cough only  Endocrine: Negative.   Genitourinary: Negative.   Musculoskeletal: Negative for arthralgias, myalgias and neck pain.  Allergic/Immunologic: Negative.   Neurological: Negative.  Negative for headaches.    Allergies  Septra  Home Medications   Current Outpatient Rx  Name  Route  Sig  Dispense  Refill  . acetaminophen (TYLENOL) 500 MG tablet   Oral   Take 1,000 mg by mouth every 8 (eight) hours as needed for moderate pain.         Marland Kitchen ibuprofen (ADVIL,MOTRIN) 400 MG tablet   Oral   Take 1 tablet (400 mg total) by mouth every 6 (six) hours as needed.   30 tablet   0   . oxyCODONE-acetaminophen (PERCOCET/ROXICET) 5-325 MG per tablet   Oral   Take 2 tablets by mouth every 4 (four) hours as needed for severe pain.   10 tablet   0    BP  95/60  Pulse 80  Temp(Src) 99.7 F (37.6 C)  Resp 20  SpO2 95% Physical Exam  Nursing note and vitals reviewed. Constitutional: She is oriented to person, place, and time. She appears well-developed and well-nourished. No distress.  Speaking in full sentences   HENT:  Head: Normocephalic and atraumatic.  Nose: Nose normal.  Mouth/Throat: Oropharynx is clear and moist. No oropharyngeal exudate.  Eyes: Conjunctivae are normal. Right eye exhibits no discharge. Left eye exhibits no discharge. No scleral icterus.  Neck: Normal range of motion. Neck supple.  Cardiovascular: Regular rhythm, normal heart sounds and normal pulses.  Tachycardia present.    Pulmonary/Chest: No accessory muscle usage. Tachypnea noted. No respiratory distress. She has decreased breath sounds. She has wheezes in the right lower field and the left lower field.  Abdominal: Soft. Bowel sounds are normal. She exhibits no distension and no mass. There is no tenderness. There is no rebound and no guarding.  Musculoskeletal: Normal range of motion. She exhibits no edema and no tenderness.  Neurological: She is alert and oriented to person, place, and time. She has normal reflexes. She exhibits normal muscle tone.  Skin: She is not diaphoretic.    ED Course  Procedures (including critical care time) Labs Review Labs Reviewed  CBC - Abnormal; Notable for the following:    WBC 21.6 (*)    All other components within normal limits  BASIC METABOLIC PANEL - Abnormal; Notable for the following:    Sodium 131 (*)    GFR calc non Af Amer 85 (*)    All other components within normal limits  CULTURE, BLOOD (ROUTINE X 2)  CULTURE, BLOOD (ROUTINE X 2)  CG4 I-STAT (LACTIC ACID)   Imaging Review Dg Chest 2 View  11/24/2013   CLINICAL DATA:  Cough.  Fever.  Sore throat.  EXAM: CHEST  2 VIEW  COMPARISON:  None.  FINDINGS: Lateral view degraded by patient arm position. Midline trachea. Normal heart size and mediastinal contours. No pleural effusion or pneumothorax. Bibasilar reticular nodular opacities/interstitial thickening. No lobar consolidation.  IMPRESSION: Bibasilar reticular nodular opacities, of indeterminate acuity. Although these can be seen with chronic bronchitis/ asthma, acute atypical bacterial or viral infection is favored.   Electronically Signed   By: Abigail Miyamoto M.D.   On: 11/24/2013 15:55    EKG Interpretation   None       MDM   1. Community acquired pneumonia    68 year old Latina female presents to the emergency department with chief complaint of cough, congestion, shortness of breath, fever. She also reports body aches and sweats. Patient was just seen  6 days ago the emergency department for abdominal pain with cough only. Her workup at that time was negative. Today she does not report abdominal pain, nausea vomiting diarrhea, frequency urgency dysuria, or other symptoms.  Physical exam yields diffuse, primarily by basilar wheezes, tachypnea in the upper 20s to low 30s, and otherwise unremarkable physical exam. Despite the tachypnea there is no evidence of increased work of breathing with accessory muscle use. Patient can speak in full sentences and she is alert and oriented.  Triaged initiated protocol reveals a CBC specific for a white blood cell count of 21. Lactic acid was normal. Basic metabolic panel is pending.  Chest x-ray was ordered but has not yet been obtained. Likely axilla patient has pneumonia I will start antibiotics to cover for community-acquired pneumonia of Rocephin and azithromycin. I will also order a nebulizer treatment and a fluid bolus of  1000 miles normal saline. Disposition will be pending ER course.  Chest x-ray consistent with multi-lobar pneumonia based on my read. Based on patient's age, her tachypnea, and limited ability for followup I will consult internal medicine for admission.  Medicine to admit pt.    Elmer Sow, MD 11/24/13 [phone]

## 2013-11-24 NOTE — ED Notes (Signed)
Pt reports shes had sweats, cough with phlegm and body aches for the past week. A&ox4, resp e/u

## 2013-11-24 NOTE — ED Notes (Signed)
Patient returned from X-ray 

## 2013-11-25 LAB — URINALYSIS W MICROSCOPIC + REFLEX CULTURE
Bilirubin Urine: NEGATIVE
GLUCOSE, UA: NEGATIVE mg/dL
Ketones, ur: 15 mg/dL — AB
Nitrite: NEGATIVE
Protein, ur: NEGATIVE mg/dL
Specific Gravity, Urine: 1.016 (ref 1.005–1.030)
Urobilinogen, UA: 1 mg/dL (ref 0.0–1.0)
pH: 7 (ref 5.0–8.0)

## 2013-11-25 LAB — BASIC METABOLIC PANEL
BUN: 9 mg/dL (ref 6–23)
CALCIUM: 8.1 mg/dL — AB (ref 8.4–10.5)
CHLORIDE: 110 meq/L (ref 96–112)
CO2: 22 mEq/L (ref 19–32)
Creatinine, Ser: 0.71 mg/dL (ref 0.50–1.10)
GFR calc Af Amer: >90 mL/min (ref >90)
GFR calc non Af Amer: 88 mL/min — ABNORMAL LOW (ref >90)
Glucose, Bld: 101 mg/dL — ABNORMAL HIGH (ref 70–99)
Potassium: 4.5 mEq/L (ref 3.7–5.3)
Sodium: 142 mEq/L (ref 137–147)

## 2013-11-25 LAB — RESPIRATORY VIRUS PANEL
Adenovirus: NOT DETECTED
Influenza A H1: NOT DETECTED
Influenza A H3: NOT DETECTED
Influenza A: NOT DETECTED
Influenza B: NOT DETECTED
METAPNEUMOVIRUS: NOT DETECTED
PARAINFLUENZA 1 A: NOT DETECTED
PARAINFLUENZA 2 A: NOT DETECTED
Parainfluenza 3: NOT DETECTED
RHINOVIRUS: NOT DETECTED
Respiratory Syncytial Virus A: NOT DETECTED
Respiratory Syncytial Virus B: NOT DETECTED

## 2013-11-25 LAB — CBC
HEMATOCRIT: 36.2 % (ref 36.0–46.0)
Hemoglobin: 12.2 g/dL (ref 12.0–15.0)
MCH: 30.5 pg (ref 26.0–34.0)
MCHC: 33.7 g/dL (ref 30.0–36.0)
MCV: 90.5 fL (ref 78.0–100.0)
Platelets: 370 10*3/uL (ref 150–400)
RBC: 4 MIL/uL (ref 3.87–5.11)
RDW: 13.3 % (ref 11.5–15.5)
WBC: 15.5 10*3/uL — ABNORMAL HIGH (ref 4.0–10.5)

## 2013-11-25 LAB — LEGIONELLA ANTIGEN, URINE: Legionella Antigen, Urine: NEGATIVE

## 2013-11-25 LAB — STREP PNEUMONIAE URINARY ANTIGEN: Strep Pneumo Urinary Antigen: NEGATIVE

## 2013-11-25 MED ORDER — OSELTAMIVIR PHOSPHATE 75 MG PO CAPS
75.0000 mg | ORAL_CAPSULE | Freq: Two times a day (BID) | ORAL | Status: DC
  Administered 2013-11-25 – 2013-11-26 (×2): 75 mg via ORAL
  Filled 2013-11-25 (×3): qty 1

## 2013-11-25 MED ORDER — OSELTAMIVIR PHOSPHATE 30 MG PO CAPS
30.0000 mg | ORAL_CAPSULE | Freq: Two times a day (BID) | ORAL | Status: DC
  Administered 2013-11-25: 30 mg via ORAL
  Filled 2013-11-25 (×2): qty 1

## 2013-11-25 NOTE — Progress Notes (Signed)
Utilization review completed.  

## 2013-11-25 NOTE — Progress Notes (Signed)
[facility] PROGRESS NOTE  Chakira Jachim SWF:[id number] DOB: 1946/01/25 DOA: 11/24/2013 PCP: No PCP Per Patient  HPI/Subjective: Bianca Henderson is a 68 yo female with a PMH of GI cancer presents to the ED with 1-week hx of subjective fevers, chills, coughing with yellow-green sputum and myalgias.  Information in the ED was obtained from interpreter.  Pt denies any chest discomfort, shortness of breath, hemoptysis or abdominal pain.  No hematochezia or melena.  Pt was in ED on 11/18/13 with right lower quadrant abdominal pain which has resolved.  CT of abdomen and pelvis on 11/18/13 was negative for any acute findings.    Pt stated she is feeling better today.  Exam was limited due to language barrier.  Pt is still coughing and producing yellow sputum.  Pt denies any N/V or abdominal pain.  Pt exhibited diaphoresis on examination.   Assessment/Plan:   Community acquired pneumonia -Pt presented to the ED with cough with yellow sputum production -CXR shows bibasilar reticular nodular opacities -Urine Legionella antigen, respiratory viral panel and Influenza PCR still pending -Strep pneumo urinary antigen is negative -Blood Cx obtained in ED have shown no growth to date -Pt started on IV Zithromax and rocephin -Continue on IV Abx, flutter valve and albuterol as needed  Influenza -Pt presented to ED with subjective fever, chills, myalgias and arthralgias -Pt exhibiting diaphoresis -Started pt on prophylactic tamiflu, influenza panel pending -Continue to monitor  Hyponatremia -Likely secondary to volume depletion -Pt presented to ED with Na of 131 and was given 1L NS in ED -Current Na is 142 -This is resolved with IV fluid hydration.  Leukocytosis -Likely secondary to CAP or Influenza -Pt presented to the ED with WBC of 21.6 -Pt was started on IV zithromax and rocephin -WBC on 2/9 trended down to 15.5 -UA exhibited leukocytes and bacteria.  Urine Cx pendint -Continue IV abx and check CBC  in AM  Dental caries -Follow-up as outpatient  ? UTI -Urinalysis is concerning for UTI, patient is on Rocephin for her pneumonia. -Adjust antibiotics according to culture results.   DVT Prophylaxis:  SQ Lovenox  Code Status: Full Code Family Communication: Spoke only with pt during examination Disposition Plan: Remain Inpatient   Consultants:  None  Procedures:  None  Antibiotics:  IV Zithromax  IV Rocephin  Objective: Filed Vitals:   11/24/13 1828 11/24/13 2226 11/25/13 0538 11/25/13 0900  BP: 100/48 103/65 114/67   Pulse: 84 88 78   Temp: 98.1 F (36.7 C) 98.5 F (36.9 C) 98.6 F (37 C)   TempSrc: Oral Oral Oral   Resp: 20 20 20    Height:    4' 11.06" (1.5 m)  Weight:    62.6 kg (138 lb 0.1 oz)  SpO2: 92% 92% 93%     Intake/Output Summary (Last 24 hours) at 11/25/13 1022 Last data filed at 11/25/13 0617  Gross per 24 hour  Intake   1270 ml  Output    300 ml  Net    970 ml   Filed Weights   11/25/13 0900  Weight: 62.6 kg (138 lb 0.1 oz)    Exam: General: Well developed, well nourished, NAD, appears stated age HEENT:  PERRLA, EOMI, Anicteic Sclera, No pharyngeal erythema or exudates  Neck: Supple, no JVD, no masses, no cervical lymphadenopathy   Cardiovascular: RRR, S1 S2 auscultated, no rubs, murmurs or gallops.   Respiratory: Scattered rhonchi with expiratory wheezes.  Good air movement b/l Abdomen: Soft,nondistended, + bowel sounds, tenderness is LUQ Extremities:  warm dry without cyanosis clubbing, +1 edema Neuro: AAOx3, cranial nerves grossly intact. Strength 2/5 in upper extremities but unclear if pt understood instructions  Psych: Pleasant affect   Data Reviewed: Basic Metabolic Panel:  Recent Labs Lab 11/18/13 1436 11/24/13 1421 11/25/13 0815  NA 141 131* 142  K 4.1 4.2 4.5  CL 103 97 110  CO2 23 21 22   GLUCOSE 89 97 101*  BUN 17 14 9   CREATININE 0.80 0.78 0.71  CALCIUM 8.5 8.7 8.1*   Liver Function Tests:  Recent  Labs Lab 11/18/13 1436  AST 21  ALT 15  ALKPHOS 75  BILITOT 0.3  PROT 7.4  ALBUMIN 3.5   CBC:  Recent Labs Lab 11/18/13 1436 11/24/13 1421 11/25/13 0815  WBC 7.7 21.6* 15.5*  NEUTROABS 4.4  --   --   HGB 13.9 13.4 12.2  HCT 39.6 38.7 36.2  MCV 90.0 90.4 90.5  PLT 213 380 370    Recent Results (from the past 240 hour(s))  URINE CULTURE     Status: None   Collection Time    11/18/13  4:57 PM      Result Value Range Status   Specimen Description URINE, CLEAN CATCH   Final   Special Requests NONE   Final   Culture  Setup Time     Final   Value: 11/18/2013 18:28     Performed at [place]     Final   Value: NO GROWTH     Performed at [facility]   Culture     Final   Value: NO GROWTH     Performed at [facility]   Report Status 11/19/2013 FINAL   Final  CULTURE, BLOOD (ROUTINE X 2)     Status: None   Collection Time    11/24/13  3:22 PM      Result Value Range Status   Specimen Description BLOOD RIGHT HAND   Final   Special Requests BOTTLES DRAWN AEROBIC AND ANAEROBIC 5 CC   Final   Culture  Setup Time     Final   Value: 11/24/2013 22:00     Performed at [facility]   Culture     Final   Value:        BLOOD CULTURE RECEIVED NO GROWTH TO DATE CULTURE WILL BE HELD FOR 5 DAYS BEFORE ISSUING A FINAL NEGATIVE REPORT     Performed at [facility]   Report Status PENDING   Incomplete  CULTURE, BLOOD (ROUTINE X 2)     Status: None   Collection Time    11/24/13  3:57 PM      Result Value Range Status   Specimen Description BLOOD RIGHT ARM   Final   Special Requests BOTTLES DRAWN AEROBIC AND ANAEROBIC 5 CC   Final   Culture  Setup Time     Final   Value: 11/24/2013 22:01     Performed at [facility]   Culture     Final   Value:        BLOOD CULTURE RECEIVED NO GROWTH TO DATE CULTURE WILL BE HELD FOR 5 DAYS BEFORE ISSUING A FINAL NEGATIVE REPORT     Performed at [facility]   Report  Status PENDING   Incomplete     Studies: Dg Chest 2 View  11/24/2013   CLINICAL DATA:  Cough.  Fever.  Sore throat.  EXAM: CHEST  2 VIEW  COMPARISON:  None.  FINDINGS: Lateral view degraded by patient arm position. Midline trachea. Normal heart size and mediastinal contours. No pleural effusion or pneumothorax. Bibasilar reticular nodular opacities/interstitial thickening. No lobar consolidation.  IMPRESSION: Bibasilar reticular nodular opacities, of indeterminate acuity. Although these can be seen with chronic bronchitis/ asthma, acute atypical bacterial or viral infection is favored.   Electronically Signed   By: Abigail Miyamoto M.D.   On: 11/24/2013 15:55    Scheduled Meds: . azithromycin  500 mg Oral Daily  . cefTRIAXone (ROCEPHIN)  IV  1 g Intravenous Q24H  . enoxaparin (LOVENOX) injection  40 mg Subcutaneous Q24H  . oseltamivir  30 mg Oral BID   Continuous Infusions: . sodium chloride 100 mL/hr at 11/25/13 0421    Active Problems:   Dental caries   CAP (community acquired pneumonia)   Leukocytosis   Hyponatremia    Cameron Proud PA-S [facility] Pager [phone]. If 7PM-7AM, please contact night-coverage at [email or web address], password [facility] 11/25/2013, 10:22 AM  LOS: 1 day    Addendum  Patient seen and examined, chart and data base reviewed.  I agree with the above assessment and plan.  For full details please see Mr. Cameron Proud PA-S note.  I reviewed and addended the above note as needed.   Birdie Hopes, MD [facility] Pager: [phone] 11/25/2013, 2:54 PM

## 2013-11-26 ENCOUNTER — Encounter (HOSPITAL_COMMUNITY): Payer: Self-pay | Admitting: General Practice

## 2013-11-26 DIAGNOSIS — J189 Pneumonia, unspecified organism: Secondary | ICD-10-CM

## 2013-11-26 DIAGNOSIS — E785 Hyperlipidemia, unspecified: Secondary | ICD-10-CM

## 2013-11-26 HISTORY — DX: Pneumonia, unspecified organism: J18.9

## 2013-11-26 LAB — URINE CULTURE
Colony Count: NO GROWTH
Culture: NO GROWTH

## 2013-11-26 LAB — CBC
HEMATOCRIT: 35.1 % — AB (ref 36.0–46.0)
HEMOGLOBIN: 12.1 g/dL (ref 12.0–15.0)
MCH: 30.9 pg (ref 26.0–34.0)
MCHC: 34.5 g/dL (ref 30.0–36.0)
MCV: 89.8 fL (ref 78.0–100.0)
Platelets: 414 10*3/uL — ABNORMAL HIGH (ref 150–400)
RBC: 3.91 MIL/uL (ref 3.87–5.11)
RDW: 13.2 % (ref 11.5–15.5)
WBC: 12.8 10*3/uL — AB (ref 4.0–10.5)

## 2013-11-26 LAB — BASIC METABOLIC PANEL
BUN: 8 mg/dL (ref 6–23)
CHLORIDE: 108 meq/L (ref 96–112)
CO2: 21 mEq/L (ref 19–32)
Calcium: 8.2 mg/dL — ABNORMAL LOW (ref 8.4–10.5)
Creatinine, Ser: 0.71 mg/dL (ref 0.50–1.10)
GFR calc Af Amer: >90 mL/min (ref >90)
GFR, EST NON AFRICAN AMERICAN: 88 mL/min — AB (ref >90)
GLUCOSE: 101 mg/dL — AB (ref 70–99)
POTASSIUM: 4.1 meq/L (ref 3.7–5.3)
SODIUM: 142 meq/L (ref 137–147)

## 2013-11-26 MED ORDER — GUAIFENESIN ER 600 MG PO TB12
1200.0000 mg | ORAL_TABLET | Freq: Two times a day (BID) | ORAL | Status: DC
  Administered 2013-11-26 – 2013-11-27 (×3): 1200 mg via ORAL
  Filled 2013-11-26 (×5): qty 2

## 2013-11-26 MED ORDER — PNEUMOCOCCAL VAC POLYVALENT 25 MCG/0.5ML IJ INJ
0.5000 mL | INJECTION | INTRAMUSCULAR | Status: DC
  Filled 2013-11-26: qty 0.5

## 2013-11-26 NOTE — Progress Notes (Signed)
Interpreter Lesle Chris for [employer]

## 2013-11-26 NOTE — Progress Notes (Signed)
[facility] PROGRESS NOTE  Peace Noyes KGY:[id number] DOB: 1946/09/09 DOA: 11/24/2013 PCP: No PCP Per Patient  HPI/Subjective: Shakhia Gramajo is a 68 yo female with a PMH of GI cancer presents to the ED with 1-week hx of subjective fevers, chills, coughing with yellow-green sputum and myalgias. Information in the ED was obtained from interpreter. Pt denies any chest discomfort, shortness of breath, hemoptysis or abdominal pain. No hematochezia or melena. Pt was in ED on 11/18/13 with right lower quadrant abdominal pain which has resolved. CT of abdomen and pelvis on 11/18/13 was negative for any acute findings.   Pt stated she is feeling better today.  Minimum cough, pleuritic chest pain from coughing.  Pt said she is still producing a small amount of sputum.  Pt did not appear febrile on examination.    Assessment/Plan: Community acquired pneumonia  -Pt presented to the ED with cough with yellow sputum production  -CXR shows bibasilar reticular nodular opacities  -Urine Legionella antigen is negative -Strep pneumo urinary antigen is negative  -Blood Cx obtained in ED have shown no growth to date  -Pt started on IV Zithromax and rocephin  -Patient was started on Tamiflu empirically, discontinued after the negative flu panel. -Ambulate pt -Continue on IV Abx, flutter valve and albuterol as needed  Hyponatremia  -Likely secondary to volume depletion  -Pt presented to ED with Na of 131 and was given 1L NS in ED  -Current Na is 142  -This is resolved with IV fluid hydration.   Leukocytosis  -Likely secondary to CAP or Influenza  -Pt presented to the ED with WBC of 21.6  -Pt was started on IV zithromax and rocephin  -WBC on 2/10 trended down to 12.8 -Urine Cx negative for growth -Continue IV abx and check CBC in AM   Dental caries  -Follow-up as outpatient   DVT Prophylaxis:  SQ Lovenox  Code Status: Full Code Family Communication: Spoke only with pt during examination Disposition  Plan: Remain inpatient.  Ambulate pt for potential d/c on 2/11   Consultants:  None  Procedures:  None  Antibiotics:  IV Zithromax  IV Rocephin  Objective: Filed Vitals:   11/25/13 0900 11/25/13 1301 11/25/13 2152 11/26/13 0542  BP:  102/67 122/70 110/67  Pulse:  77 69 65  Temp:  98.4 F (36.9 C) 99.3 F (37.4 C) 98.2 F (36.8 C)  TempSrc:  Oral Oral Oral  Resp:  _0 Height: 4' 11.06" (1.5 m)     Weight: 62.6 kg (138 lb 0.1 oz)     SpO2:  92% 92% 93%    Intake/Output Summary (Last 24 hours) at 11/26/13 1036 Last data filed at 11/26/13 0900  Gross per 24 hour  Intake   1440 ml  Output   1300 ml  Net    140 ml   Filed Weights   11/25/13 0900  Weight: 62.6 kg (138 lb 0.1 oz)    Exam: General: Well developed, well nourished, NAD, appears stated age  HEENT: PERRLA, EOMI, Anicteic Sclera, No pharyngeal erythema or exudates  Neck: Supple, no JVD, no masses, no cervical lymphadenopathy  Cardiovascular: RRR, S1 S2 auscultated, no rubs, murmurs or gallops.  Respiratory: Scattered rhonchi. Good air movement b/l  Abdomen: Soft,nondistended, + bowel sounds, tenderness is RUQ  Extremities: warm dry without cyanosis clubbing, +1 edema  Neuro: AAOx3, cranial nerves grossly intact. Strength 2/5 in upper extremities but unclear if pt understood instructions  Psych: Pleasant affect   Data Reviewed: Basic  Metabolic Panel:  Recent Labs Lab 11/24/13 1421 11/25/13 0815 11/26/13 0524  NA 131* 142 142  K 4.2 4.5 4.1  CL 97 110 108  CO2 _0 GLUCOSE 97 101* 101*  BUN _1 CREATININE 0.78 0.71 0.71  CALCIUM 8.7 8.1* 8.2*   CBC:  Recent Labs Lab 11/24/13 1421 11/25/13 0815 11/26/13 0524  WBC 21.6* 15.5* 12.8*  HGB 13.4 12.2 12.1  HCT 38.7 36.2 35.1*  MCV 90.4 90.5 89.8  PLT 380 370 414*    Recent Results (from the past 240 hour(s))  URINE CULTURE     Status: None   Collection Time    11/18/13  4:57 PM      Result Value Range Status    Specimen Description URINE, CLEAN CATCH   Final   Special Requests NONE   Final   Culture  Setup Time     Final   Value: 11/18/2013 18:28     Performed at [facility]     Final   Value: NO GROWTH     Performed at [facility]   Culture     Final   Value: NO GROWTH     Performed at [facility]   Report Status 11/19/2013 FINAL   Final  CULTURE, BLOOD (ROUTINE X 2)     Status: None   Collection Time    11/24/13  3:22 PM      Result Value Range Status   Specimen Description BLOOD RIGHT HAND   Final   Special Requests BOTTLES DRAWN AEROBIC AND ANAEROBIC 5 CC   Final   Culture  Setup Time     Final   Value: 11/24/2013 22:00     Performed at [facility]   Culture     Final   Value:        BLOOD CULTURE RECEIVED NO GROWTH TO DATE CULTURE WILL BE HELD FOR 5 DAYS BEFORE ISSUING A FINAL NEGATIVE REPORT     Performed at [facility]   Report Status PENDING   Incomplete  CULTURE, BLOOD (ROUTINE X 2)     Status: None   Collection Time    11/24/13  3:57 PM      Result Value Range Status   Specimen Description BLOOD RIGHT ARM   Final   Special Requests BOTTLES DRAWN AEROBIC AND ANAEROBIC 5 CC   Final   Culture  Setup Time     Final   Value: 11/24/2013 22:01     Performed at [facility]   Culture     Final   Value:        BLOOD CULTURE RECEIVED NO GROWTH TO DATE CULTURE WILL BE HELD FOR 5 DAYS BEFORE ISSUING A FINAL NEGATIVE REPORT     Performed at [facility]   Report Status PENDING   Incomplete  RESPIRATORY VIRUS PANEL     Status: None   Collection Time    11/24/13  6:31 PM      Result Value Range Status   Source - RVPAN NASOPHARYNGEAL   Final   Respiratory Syncytial Virus A NOT DETECTED   Final   Respiratory Syncytial Virus B NOT DETECTED   Final   Influenza A NOT DETECTED   Final   Influenza B NOT DETECTED   Final   Parainfluenza 1 NOT DETECTED   Final   Parainfluenza 2 NOT DETECTED   Final   Parainfluenza  3 NOT DETECTED   Final   Metapneumovirus NOT DETECTED   Final   Rhinovirus NOT DETECTED   Final   Adenovirus NOT DETECTED   Final   Influenza A H1 NOT DETECTED   Final   Influenza A H3 NOT DETECTED   Final   Comment: (NOTE)           Normal Reference Range for each Analyte: NOT DETECTED     Testing performed using the Luminex xTAG Respiratory Viral Panel test     kit.     This test was developed and its performance characteristics determined     by [facility]. It has not been cleared or approved by the [place]     Food and Drug Administration. This test is used for clinical purposes.     It should not be regarded as investigational or for research. This     laboratory is certified under the [place] (CLIA) as qualified to perform high complexity     clinical laboratory testing.     Performed at [place]     Status: None   Collection Time    11/25/13  1:02 AM      Result Value Range Status   Specimen Description URINE, RANDOM   Final   Special Requests ADDED 0221   Final   Culture  Setup Time     Final   Value: 11/25/2013 08:39     Performed at [place]     Final   Value: NO GROWTH     Performed at [facility]   Culture     Final   Value: NO GROWTH     Performed at [facility]   Report Status 11/26/2013 FINAL   Final     Studies: Dg Chest 2 View  11/24/2013   CLINICAL DATA:  Cough.  Fever.  Sore throat.  EXAM: CHEST  2 VIEW  COMPARISON:  None.  FINDINGS: Lateral view degraded by patient arm position. Midline trachea. Normal heart size and mediastinal contours. No pleural effusion or pneumothorax. Bibasilar reticular nodular opacities/interstitial thickening. No lobar consolidation.  IMPRESSION: Bibasilar reticular nodular opacities, of indeterminate acuity. Although these can be seen with chronic bronchitis/ asthma, acute atypical bacterial or viral infection  is favored.   Electronically Signed   By: Abigail Miyamoto M.D.   On: 11/24/2013 15:55    Scheduled Meds: . azithromycin  500 mg Oral Daily  . cefTRIAXone (ROCEPHIN)  IV  1 g Intravenous Q24H  . enoxaparin (LOVENOX) injection  40 mg Subcutaneous Q24H  . oseltamivir  75 mg Oral BID   Continuous Infusions: . sodium chloride 100 mL/hr at 11/26/13 5621    Active Problems:   Dental caries   CAP (community acquired pneumonia)   Leukocytosis   Hyponatremia    Cameron Proud PA-S [facility] Pager [phone]. If 7PM-7AM, please contact night-coverage at [email or web address], password [facility] 11/26/2013, 10:36 AM  LOS: 2 days     Addendum  Patient seen and examined, chart and data base reviewed.  I agree with the above assessment and plan.  For full details please see Mr. Cameron Proud PA-S note.  I reviewed that addended the above note as needed.   Birdie Hopes, MD [facility] Pager: [phone] 11/26/2013, 11:11 AM

## 2013-11-27 LAB — CBC
HEMATOCRIT: 37.6 % (ref 36.0–46.0)
HEMOGLOBIN: 12.9 g/dL (ref 12.0–15.0)
MCH: 30.9 pg (ref 26.0–34.0)
MCHC: 34.3 g/dL (ref 30.0–36.0)
MCV: 90 fL (ref 78.0–100.0)
Platelets: 465 10*3/uL — ABNORMAL HIGH (ref 150–400)
RBC: 4.18 MIL/uL (ref 3.87–5.11)
RDW: 13.2 % (ref 11.5–15.5)
WBC: 12.1 10*3/uL — ABNORMAL HIGH (ref 4.0–10.5)

## 2013-11-27 MED ORDER — GUAIFENESIN ER 600 MG PO TB12: 1200.0000 mg | ORAL_TABLET | Freq: Two times a day (BID) | ORAL | Status: DC

## 2013-11-27 MED ORDER — LEVOFLOXACIN 750 MG PO TABS: 750.0000 mg | ORAL_TABLET | Freq: Every day | ORAL | Status: AC

## 2013-11-27 NOTE — Progress Notes (Signed)
Bianca Henderson to be D/C'd Home per MD order.  Discussed with the patient and intrepreter and all questions fully answered.    Medication List         acetaminophen 500 MG tablet  Commonly known as:  TYLENOL  Take 1,000 mg by mouth every 8 (eight) hours as needed for moderate pain.     guaiFENesin 600 MG 12 hr tablet  Commonly known as:  MUCINEX  Take 2 tablets (1,200 mg total) by mouth 2 (two) times daily.     levofloxacin 750 MG tablet  Commonly known as:  LEVAQUIN  Take 1 tablet (750 mg total) by mouth daily. Starting 11/28/13        VVS, Skin clean, dry and intact without evidence of skin break down, no evidence of skin tears noted. IV catheter discontinued intact. Site without signs and symptoms of complications. Dressing and pressure applied.  An After Visit Summary was printed and given to the patient. Follow up appointments , new prescriptions and medication administration times given. Interpretor went over all med administration times, PNA education in [place] and MD appointments. Pt questions answered by interpretor and nurse.  Patient escorted via [place], and D/C home via private auto.  Park Breed, RN 11/27/2013 3:45 PM

## 2013-11-27 NOTE — Discharge Summary (Signed)
Physician Discharge Summary  [name] VUY:[id number] DOB: [birth year] DOA: 11/24/2013  PCP: No PCP Per Patient  Admit date: 11/24/2013 Discharge date: 11/27/2013  Time spent: 50 minutes  Recommendations for Outpatient Follow-up:  1. Continue on Levaquin 772m daily x 5 days starting on 11/28/13 2. Repeat chest x-ray in 6-8 weeks to document resolution of pneumonia  Discharge Diagnoses:  Active Problems:   Dental caries   CAP (community acquired pneumonia)   Leukocytosis   Hyponatremia   Discharge Condition: Stable  Diet recommendation: Regular diet  Filed Weights   11/25/13 0900  Weight: 62.6 kg (138 lb 0.1 oz)    History of present illness:  [name] a 68yo female with a PMH of GI cancer presents to the ED with 1-week hx of subjective fevers, chills, coughing with yellow-green sputum and myalgias. Information in the ED was obtained from interpreter.  Pt was in ED on 11/18/13 with right lower quadrant abdominal pain which has resolved. CT of abdomen and pelvis on 11/18/13 was negative for any acute findings. Pt had WBC of 21.6, temp of 99.7 and O2 Sat at 94% on room air in ED  Hospital Course:  Community Acquired Pneumonia -Pt presented to ED with cough with yellow sputum production -CXR shows bibasilar reticular nodular opacities -Urine Legionella antigen and Strep pneumo urinary antigen both negative -Blood Cx shows no growth to date -Empiric tamiflu was started, d/c after negative flu panel -Treated with IV Zithromax and Rocephin, significantly improved, no longer febrile. Still some cough, but significantly better than on admission - Stable for d/c pt with Levaquin 7542mpo daily x 5 days starting on 11/28/13 - Will need repeat x-ray in 6-8 weeks to document resolution of pneumonia  Hyponatremia -Presented to ED with Na of 131 likely secondary to volume depletion -Resolved with IV fluid hydration, Na is 142 on 2/10  Leukocytosis -Pt presented to ED with WBC of 21.6,  likely secondary to CAP -Pt was treated with IV Zithromax and rocephin -WBC on 2/11 is 12/1 -Discharge with 5-day therapy of Levaquin  Dental caries -Follow-up at community health center  Procedures:  None  Consultations:  None  Discharge Exam: Filed Vitals:   11/27/13 0507  BP: 91/55  Pulse: 61  Temp: 98.4 F (36.9 C)  Resp: 18   General: Well developed, well nourished, NAD, appears stated age  HEENT: PERRLA, EOMI, Anicteic Sclera, No pharyngeal erythema or exudates  Neck: Supple, no JVD, no masses, no cervical lymphadenopathy  Cardiovascular: RRR, S1 S2 auscultated, no rubs, murmurs or gallops.  Respiratory:Good air movement b/l, clear to auscultation b/l  Abdomen: Soft,nondistended, + bowel sounds, non-tender  Extremities: warm dry without cyanosis clubbing, +1 edema  Neuro: AAOx3, cranial nerves grossly intact. Strength 5/5 all  Psych: Pleasant affect   Discharge Instructions       Future Appointments Provider Department Dept Phone   12/13/2013 2:00 PM [facility]     Medication List    ASK your doctor about these medications       acetaminophen 500 MG tablet  Commonly known as:  TYLENOL  Take 1,000 mg by mouth every 8 (eight) hours as needed for moderate pain.       Allergies  Allergen Reactions  . Septra [Sulfamethoxazole-Trimethoprim] Itching and Rash      The results of significant diagnostics from this hospitalization (including imaging, microbiology, ancillary and laboratory) are listed below for reference.  Significant Diagnostic Studies: Ct Abdomen Pelvis Wo Contrast  11/18/2013   CLINICAL DATA:  Right flank and groin discomfort, history of previous appendectomy and small bowel surgery  EXAM: CT ABDOMEN AND PELVIS WITHOUT CONTRAST  TECHNIQUE: Multidetector CT imaging of the abdomen and pelvis was performed following the standard protocol without intravenous contrast.   COMPARISON:  None.  FINDINGS: The stomach is nondistended. A small hiatal hernia is present. The small and large bowel loops exhibit no evidence of ileus nor obstruction or wall thickening. There is surgical suture material in the right mid and lower abdomen consistent with previous appendectomy and reported small bowel resection. There is no finding to suggest diverticulitis.  The gallbladder is adequately distended and contains multiple large rim calcified stones. There is no gallbladder wall thickening nor pericholecystic fluid. The liver, pancreas, spleen, adrenal glands, and kidneys exhibit no acute abnormalities. The perinephric fat is normal in appearance. The caliber of the abdominal aorta is normal.  The uterus and adnexal structures are within the limits of normal. A cyst versus dominant follicle in the left ovary measures 1.8 cm in greatest dimension. There are phleboliths within the pelvis. The partially distended urinary bladder is grossly normal.  The lumbar vertebral bodies are preserved in height. The bony pelvis is normal in appearance.  IMPRESSION: 1. There are multiple calcified stones occupying the volume of the moderately distended gallbladder. There is no definite pericholecystic fluid. Further evaluation with right upper quadrant ultrasound is recommended. 2. There is no evidence of bowel obstruction or ileus or acute inflammation. 3. There is no evidence of urinary tract stones nor obstruction or acute inflammation. 4. There is likely a small left ovarian cyst versus dominant follicle.   Electronically Signed   By: David  Martinique   On: 11/18/2013 17:42   Dg Chest 2 View  11/24/2013   CLINICAL DATA:  Cough.  Fever.  Sore throat.  EXAM: CHEST  2 VIEW  COMPARISON:  None.  FINDINGS: Lateral view degraded by patient arm position. Midline trachea. Normal heart size and mediastinal contours. No pleural effusion or pneumothorax. Bibasilar reticular nodular opacities/interstitial thickening. No lobar  consolidation.  IMPRESSION: Bibasilar reticular nodular opacities, of indeterminate acuity. Although these can be seen with chronic bronchitis/ asthma, acute atypical bacterial or viral infection is favored.   Electronically Signed   By: Abigail Miyamoto M.D.   On: 11/24/2013 15:55    Microbiology: Recent Results (from the past 240 hour(s))  URINE CULTURE     Status: None   Collection Time    11/18/13  4:57 PM      Result Value Ref Range Status   Specimen Description URINE, CLEAN CATCH   Final   Special Requests NONE   Final   Culture  Setup Time     Final   Value: 11/18/2013 18:28     Performed at [facility]     Final   Value: NO GROWTH     Performed at [facility]   Culture     Final   Value: NO GROWTH     Performed at [facility]   Report Status 11/19/2013 FINAL   Final  CULTURE, BLOOD (ROUTINE X 2)     Status: None   Collection Time    11/24/13  3:22 PM      Result Value Ref Range Status   Specimen Description BLOOD RIGHT HAND   Final   Special Requests BOTTLES DRAWN AEROBIC AND ANAEROBIC 5  CC   Final   Culture  Setup Time     Final   Value: 11/24/2013 22:00     Performed at [facility]   Culture     Final   Value:        BLOOD CULTURE RECEIVED NO GROWTH TO DATE CULTURE WILL BE HELD FOR 5 DAYS BEFORE ISSUING A FINAL NEGATIVE REPORT     Performed at [facility]   Report Status PENDING   Incomplete  CULTURE, BLOOD (ROUTINE X 2)     Status: None   Collection Time    11/24/13  3:57 PM      Result Value Ref Range Status   Specimen Description BLOOD RIGHT ARM   Final   Special Requests BOTTLES DRAWN AEROBIC AND ANAEROBIC 5 CC   Final   Culture  Setup Time     Final   Value: 11/24/2013 22:01     Performed at [facility]   Culture     Final   Value:        BLOOD CULTURE RECEIVED NO GROWTH TO DATE CULTURE WILL BE HELD FOR 5 DAYS BEFORE ISSUING A FINAL NEGATIVE REPORT     Performed at [facility]    Report Status PENDING   Incomplete  RESPIRATORY VIRUS PANEL     Status: None   Collection Time    11/24/13  6:31 PM      Result Value Ref Range Status   Source - RVPAN NASOPHARYNGEAL   Final   Respiratory Syncytial Virus A NOT DETECTED   Final   Respiratory Syncytial Virus B NOT DETECTED   Final   Influenza A NOT DETECTED   Final   Influenza B NOT DETECTED   Final   Parainfluenza 1 NOT DETECTED   Final   Parainfluenza 2 NOT DETECTED   Final   Parainfluenza 3 NOT DETECTED   Final   Metapneumovirus NOT DETECTED   Final   Rhinovirus NOT DETECTED   Final   Adenovirus NOT DETECTED   Final   Influenza A H1 NOT DETECTED   Final   Influenza A H3 NOT DETECTED   Final   Comment: (NOTE)           Normal Reference Range for each Analyte: NOT DETECTED     Testing performed using the Luminex xTAG Respiratory Viral Panel test     kit.     This test was developed and its performance characteristics determined     by [facility]. It has not been cleared or approved by the [place]     Food and Drug Administration. This test is used for clinical purposes.     It should not be regarded as investigational or for research. This     laboratory is certified under the [place] (CLIA) as qualified to perform high complexity     clinical laboratory testing.     Performed at [facility]     Status: None   Collection Time    11/25/13  1:02 AM      Result Value Ref Range Status   Specimen Description URINE, RANDOM   Final   Special Requests ADDED 0221   Final   Culture  Setup Time     Final   Value: 11/25/2013 08:39     Performed at [facility]     Final  Value: NO GROWTH     Performed at [facility]   Culture     Final   Value: NO GROWTH     Performed at [facility]   Report Status 11/26/2013 FINAL   Final     Labs: Basic Metabolic Panel:  Recent Labs Lab 11/24/13 1421  11/25/13 0815 11/26/13 0524  NA 131* 142 142  K 4.2 4.5 4.1  CL 97 110 108  CO2 '21 22 21  ' GLUCOSE 97 101* 101*  BUN '14 9 8  ' CREATININE 0.78 0.71 0.71  CALCIUM 8.7 8.1* 8.2*   CBC:  Recent Labs Lab 11/24/13 1421 11/25/13 0815 11/26/13 0524 11/27/13 0519  WBC 21.6* 15.5* 12.8* 12.1*  HGB 13.4 12.2 12.1 12.9  HCT 38.7 36.2 35.1* 37.6  MCV 90.4 90.5 89.8 90.0  PLT 380 370 414* 465*    Signed:  Cameron Proud PA-S  [facility] 11/27/2013, 10:11 AM  Attending Patient seen and examined, and with the above assessment and plan. Significantly improved on IV antibiotics. Blood cultures, urine Legionella/streptococcal antigen negative. Transitioned to oral Levaquin for 5 more days. Stable for discharge. He needs his repeat chest x-ray in 6-8 weeks to document resolution of the pneumonia.  Nena Alexander MD

## 2013-11-27 NOTE — Care Management Note (Signed)
    Page 1 of 1   11/27/2013     11:05:31 AM   CARE MANAGEMENT NOTE 11/27/2013  Patient:  ZENAB, GRONEWOLD   Account Number:  [id number]  Date Initiated:  11/27/2013  Documentation initiated by:  Tomi Bamberger  Subjective/Objective Assessment:   dx pna  admit- lives with friend.  Has orange card and goes to community health clinic.     Action/Plan:   Anticipated DC Date:  11/27/2013   Anticipated DC Plan:  [place]  CM consult      Choice offered to / List presented to:             Status of service:  Completed, signed off Medicare Important Message given?   (If response is "NO", the following Medicare IM given date fields will be blank) Date Medicare IM given:   Date Additional Medicare IM given:    Discharge Disposition:  HOME/SELF CARE  Per UR Regulation:  Reviewed for med. necessity/level of care/duration of stay  If discussed at [place] of Stay Meetings, dates discussed:    Comments:  11/27/13 [place], BSN [phone] patient lives with friend, speaks spanish, per interpreter on 2/10, patient stated she has an orange card and she goes to the community health clinic.  No needs anticipated.

## 2013-11-30 LAB — CULTURE, BLOOD (ROUTINE X 2)
CULTURE: NO GROWTH
CULTURE: NO GROWTH

## 2013-12-13 ENCOUNTER — Ambulatory Visit: Payer: No Typology Code available for payment source | Attending: Internal Medicine | Admitting: Internal Medicine

## 2013-12-13 VITALS — BP 118/74 | HR 73 | Temp 98.2°F | Resp 16

## 2013-12-13 DIAGNOSIS — J189 Pneumonia, unspecified organism: Secondary | ICD-10-CM | POA: Insufficient documentation

## 2013-12-13 DIAGNOSIS — K029 Dental caries, unspecified: Secondary | ICD-10-CM | POA: Insufficient documentation

## 2013-12-13 DIAGNOSIS — E785 Hyperlipidemia, unspecified: Secondary | ICD-10-CM | POA: Insufficient documentation

## 2013-12-13 LAB — CBC WITH DIFFERENTIAL/PLATELET
Basophils Absolute: 0.1 10*3/uL (ref 0.0–0.1)
Basophils Relative: 1 % (ref 0–1)
EOS ABS: 0.4 10*3/uL (ref 0.0–0.7)
Eosinophils Relative: 5 % (ref 0–5)
HEMATOCRIT: 37 % (ref 36.0–46.0)
Hemoglobin: 12.4 g/dL (ref 12.0–15.0)
LYMPHS ABS: 2.4 10*3/uL (ref 0.7–4.0)
LYMPHS PCT: 32 % (ref 12–46)
MCH: 30 pg (ref 26.0–34.0)
MCHC: 33.5 g/dL (ref 30.0–36.0)
MCV: 89.6 fL (ref 78.0–100.0)
MONO ABS: 0.6 10*3/uL (ref 0.1–1.0)
Monocytes Relative: 8 % (ref 3–12)
Neutro Abs: 4 10*3/uL (ref 1.7–7.7)
Neutrophils Relative %: 54 % (ref 43–77)
PLATELETS: 296 10*3/uL (ref 150–400)
RBC: 4.13 MIL/uL (ref 3.87–5.11)
RDW: 14.5 % (ref 11.5–15.5)
WBC: 7.4 10*3/uL (ref 4.0–10.5)

## 2013-12-13 LAB — COMPLETE METABOLIC PANEL WITH GFR
ALT: 11 U/L (ref 0–35)
AST: 15 U/L (ref 0–37)
Albumin: 3.8 g/dL (ref 3.5–5.2)
Alkaline Phosphatase: 62 U/L (ref 39–117)
BILIRUBIN TOTAL: 0.4 mg/dL (ref 0.2–1.2)
BUN: 15 mg/dL (ref 6–23)
CALCIUM: 8.7 mg/dL (ref 8.4–10.5)
CHLORIDE: 107 meq/L (ref 96–112)
CO2: 28 meq/L (ref 19–32)
CREATININE: 0.79 mg/dL (ref 0.50–1.10)
GFR, EST NON AFRICAN AMERICAN: 78 mL/min
GLUCOSE: 85 mg/dL (ref 70–99)
Potassium: 4 mEq/L (ref 3.5–5.3)
Sodium: 140 mEq/L (ref 135–145)
Total Protein: 6.3 g/dL (ref 6.0–8.3)

## 2013-12-13 MED ORDER — AMOXICILLIN-POT CLAVULANATE 875-125 MG PO TABS: 1.0000 | ORAL_TABLET | Freq: Two times a day (BID) | ORAL | Status: DC

## 2013-12-13 NOTE — Progress Notes (Signed)
Patient ID: Bianca Henderson, female   DOB: [birth year], 68 y.o.   MRN: [id number]   CC:  HPI: Bianca Henderson is a 68 yo female with a PMH of GI cancer admitted on 2/8 , with 1-week hx of subjective fevers, chills, coughing with yellow-green sputum and myalgias. Patient is Spanish-speaking. Pt was in ED on 11/18/13 with right lower quadrant abdominal pain which has resolved. CT of abdomen and pelvis on 11/18/13 was negative for any acute findings. Today she denies any abdominal pain vomiting but does have some nausea At the time of admission on 2/8, Pt had WBC of 21.6, temp of 99.7 and O2 Sat at 94% on room air in ED Chest x-ray showed by basilar reticulonodular opacities, blood culture negative to date, and further panel was negative patient was treated with Zithromax and Rocephin, discharged on 5 days of by mouth levofloxacin. 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  Needs referral for dental caries, no  pulmonary symptoms today  Allergies  Allergen Reactions  . Septra [Sulfamethoxazole-Trimethoprim] Itching and Rash   Past Medical History  Diagnosis Date  . Hypotension   . Cancer     instestines  . Pneumonia 11/26/2013  . Pain in lower limb     calf pain  in both legs   Current Outpatient Prescriptions on File Prior to Visit  Medication Sig Dispense Refill  . acetaminophen (TYLENOL) 500 MG tablet Take 1,000 mg by mouth every 8 (eight) hours as needed for moderate pain.      Marland Kitchen guaiFENesin (MUCINEX) 600 MG 12 hr tablet Take 2 tablets (1,200 mg total) by mouth 2 (two) times daily.  10 tablet  0   No current facility-administered medications on file prior to visit.   No family history on file. History   Social History  . Marital Status: Single    Spouse Name: N/A    Number of Children: N/A  . Years of Education: N/A   Occupational History  . Not on file.   Social History Main Topics  . Smoking status: Never Smoker   . Smokeless tobacco: Never Used  . Alcohol  Use: No  . Drug Use: No  . Sexual Activity: Not on file   Other Topics Concern  . Not on file   Social History Narrative  . No narrative on file    Review of Systems  Constitutional: Negative for fever, chills, diaphoresis, activity change, appetite change and fatigue.  HENT: Negative for ear pain, nosebleeds, congestion, facial swelling, rhinorrhea, neck pain, neck stiffness and ear discharge.   Eyes: Negative for pain, discharge, redness, itching and visual disturbance.  Respiratory: As in history of present illness.   Cardiovascular: Negative for chest pain, palpitations and leg swelling.  Gastrointestinal: Negative for abdominal distention.  Genitourinary: Negative for dysuria, urgency, frequency, hematuria, flank pain, decreased urine volume, difficulty urinating and dyspareunia.  Musculoskeletal: Negative for back pain, joint swelling, arthralgias and gait problem.  Neurological: Negative for dizziness, tremors, seizures, syncope, facial asymmetry, speech difficulty, weakness, light-headedness, numbness and headaches.  Hematological: Negative for adenopathy. Does not bruise/bleed easily.  Psychiatric/Behavioral: Negative for hallucinations, behavioral problems, confusion, dysphoric mood, decreased concentration and agitation.    Objective:  There were no vitals filed for this visit.  Physical Exam  Constitutional: Appears well-developed and well-nourished. No distress.  HENT: Normocephalic. External right and left ear normal. Oropharynx is clear and moist.  Eyes: Conjunctivae and EOM are normal. PERRLA, no scleral icterus.  Neck: Normal ROM. Neck supple.  No JVD. No tracheal deviation. No thyromegaly.  CVS: RRR, S1/S2 +, no murmurs, no gallops, no carotid bruit.  Pulmonary: Effort and breath sounds normal, no stridor, rhonchi, wheezes, rales.  Abdominal: Soft. BS +,  no distension, tenderness, rebound or guarding.  Musculoskeletal: Normal range of motion. No edema and no  tenderness.  Lymphadenopathy: No lymphadenopathy noted, cervical, inguinal. Neuro: Alert. Normal reflexes, muscle tone coordination. No cranial nerve deficit. Skin: Skin is warm and dry. No rash noted. Not diaphoretic. No erythema. No pallor.  Psychiatric: Normal mood and affect. Behavior, judgment, thought content normal.   Lab Results  Component Value Date   WBC 12.1* 11/27/2013   HGB 12.9 11/27/2013   HCT 37.6 11/27/2013   MCV 90.0 11/27/2013   PLT 465* 11/27/2013   Lab Results  Component Value Date   CREATININE 0.71 11/26/2013   BUN 8 11/26/2013   NA 142 11/26/2013   K 4.1 11/26/2013   CL 108 11/26/2013   CO2 21 11/26/2013    Lab Results  Component Value Date   HGBA1C 5.4 11/04/2013   Lipid Panel     Component Value Date/Time   CHOL 194 11/04/2013 1745   TRIG 177* 11/04/2013 1745   HDL 41 11/04/2013 1745   CHOLHDL 4.7 11/04/2013 1745   VLDL 35 11/04/2013 1745   LDLCALC 118* 11/04/2013 1745       Assessment and plan:   Patient Active Problem List   Diagnosis Date Noted  . CAP (community acquired pneumonia) 11/24/2013  . Leukocytosis 11/24/2013  . Hyponatremia 11/24/2013  . Pap smear for cervical cancer screening 11/04/2013  . Dyslipidemia 11/04/2013  . Carpal tunnel syndrome 07/11/2013  . Encounter to establish care 06/03/2013  . Dental caries 06/03/2013       Community-acquired pneumonia No residual symptoms today Repeat chest x-ray in 3-4 weeks  Routine mammogram was ordered but not scheduled Dentist referral for dental caries. The patient will be provided a prescription for Augmentin to commence few days prior to dentist appointment  Followup in 3 months   The patient was given clear instructions to go to ER or return to medical center if symptoms don't improve, worsen or new problems develop. The patient verbalized understanding. The patient was told to call to get any lab results if not heard anything in the next week.

## 2013-12-13 NOTE — Progress Notes (Signed)
Patient here for follow up on her pneumonia

## 2014-02-03 ENCOUNTER — Ambulatory Visit: Payer: Self-pay | Admitting: Internal Medicine

## 2014-03-12 ENCOUNTER — Ambulatory Visit: Payer: No Typology Code available for payment source | Admitting: Internal Medicine

## 2014-12-01 ENCOUNTER — Ambulatory Visit: Payer: No Typology Code available for payment source | Admitting: Internal Medicine

## 2014-12-08 ENCOUNTER — Ambulatory Visit: Payer: No Typology Code available for payment source | Attending: Internal Medicine | Admitting: Internal Medicine

## 2014-12-08 ENCOUNTER — Encounter: Payer: Self-pay | Admitting: Internal Medicine

## 2014-12-08 VITALS — BP 142/83 | HR 83 | Temp 98.1°F | Ht 59.0 in | Wt 145.0 lb

## 2014-12-08 DIAGNOSIS — Z8500 Personal history of malignant neoplasm of unspecified digestive organ: Secondary | ICD-10-CM | POA: Insufficient documentation

## 2014-12-08 DIAGNOSIS — M79604 Pain in right leg: Secondary | ICD-10-CM

## 2014-12-08 DIAGNOSIS — M79605 Pain in left leg: Secondary | ICD-10-CM | POA: Insufficient documentation

## 2014-12-08 DIAGNOSIS — C269 Malignant neoplasm of ill-defined sites within the digestive system: Secondary | ICD-10-CM

## 2014-12-08 DIAGNOSIS — Z1239 Encounter for other screening for malignant neoplasm of breast: Secondary | ICD-10-CM | POA: Insufficient documentation

## 2014-12-08 DIAGNOSIS — Z1211 Encounter for screening for malignant neoplasm of colon: Secondary | ICD-10-CM | POA: Insufficient documentation

## 2014-12-08 DIAGNOSIS — Z1231 Encounter for screening mammogram for malignant neoplasm of breast: Secondary | ICD-10-CM

## 2014-12-08 DIAGNOSIS — Z800 Family history of malignant neoplasm of digestive organs: Secondary | ICD-10-CM | POA: Insufficient documentation

## 2014-12-08 LAB — CBC WITH DIFFERENTIAL/PLATELET
Basophils Absolute: 0.1 10*3/uL (ref 0.0–0.1)
Basophils Relative: 1 % (ref 0–1)
EOS PCT: 8 % — AB (ref 0–5)
Eosinophils Absolute: 0.8 10*3/uL — ABNORMAL HIGH (ref 0.0–0.7)
HEMATOCRIT: 40.8 % (ref 36.0–46.0)
HEMOGLOBIN: 13.4 g/dL (ref 12.0–15.0)
LYMPHS ABS: 2.4 10*3/uL (ref 0.7–4.0)
LYMPHS PCT: 25 % (ref 12–46)
MCH: 30.2 pg (ref 26.0–34.0)
MCHC: 32.8 g/dL (ref 30.0–36.0)
MCV: 92.1 fL (ref 78.0–100.0)
MONO ABS: 0.9 10*3/uL (ref 0.1–1.0)
MPV: 10.9 fL (ref 8.6–12.4)
Monocytes Relative: 9 % (ref 3–12)
NEUTROS ABS: 5.5 10*3/uL (ref 1.7–7.7)
Neutrophils Relative %: 57 % (ref 43–77)
Platelets: 259 10*3/uL (ref 150–400)
RBC: 4.43 MIL/uL (ref 3.87–5.11)
RDW: 13.8 % (ref 11.5–15.5)
WBC: 9.7 10*3/uL (ref 4.0–10.5)

## 2014-12-08 LAB — COMPLETE METABOLIC PANEL WITH GFR
ALT: 14 U/L (ref 0–35)
AST: 15 U/L (ref 0–37)
Albumin: 3.9 g/dL (ref 3.5–5.2)
Alkaline Phosphatase: 65 U/L (ref 39–117)
BUN: 19 mg/dL (ref 6–23)
CALCIUM: 9 mg/dL (ref 8.4–10.5)
CHLORIDE: 106 meq/L (ref 96–112)
CO2: 27 meq/L (ref 19–32)
CREATININE: 0.66 mg/dL (ref 0.50–1.10)
GFR, Est African American: >89 mL/min
GFR, Est Non African American: >89 mL/min
Glucose, Bld: 85 mg/dL (ref 70–99)
Potassium: 3.8 mEq/L (ref 3.5–5.3)
Sodium: 143 mEq/L (ref 135–145)
Total Bilirubin: 0.3 mg/dL (ref 0.2–1.2)
Total Protein: 6.6 g/dL (ref 6.0–8.3)

## 2014-12-08 LAB — VITAMIN B12: VITAMIN B 12: 912 pg/mL — AB (ref 211–911)

## 2014-12-08 LAB — LIPID PANEL
CHOLESTEROL: 213 mg/dL — AB (ref 0–200)
HDL: 38 mg/dL — ABNORMAL LOW (ref >=46)
LDL CALC: 113 mg/dL — AB (ref 0–99)
TRIGLYCERIDES: 308 mg/dL — AB (ref <150)
Total CHOL/HDL Ratio: 5.6 Ratio
VLDL: 62 mg/dL — ABNORMAL HIGH (ref 0–40)

## 2014-12-08 LAB — POCT GLYCOSYLATED HEMOGLOBIN (HGB A1C): HEMOGLOBIN A1C: 5.6

## 2014-12-08 LAB — TSH: TSH: 3.422 u[IU]/mL (ref 0.350–4.500)

## 2014-12-08 MED ORDER — NAPROXEN 500 MG PO TABS: 500.0000 mg | ORAL_TABLET | Freq: Two times a day (BID) | ORAL | Status: DC

## 2014-12-08 NOTE — Progress Notes (Signed)
Patient if here complaining of having pain in both arches of both feet, but the right side is worse.  Patient also c/o having pain over the right breast that is worse with movement.  Patient also tripped yesterday and scratched her right leg with a nail.  Patient also request to have a mammogram done. She states this was ordered last year but she never heard about an appointment.

## 2014-12-08 NOTE — Progress Notes (Signed)
Patient ID: Bianca Henderson, female   DOB: 02-11-1946, 69 y.o.   MRN: [id number]   Bianca Henderson, is a 69 y.o. female  AJG:[id number]  BTD:[id number]  DOB - 1946-03-07  Chief Complaint  Patient presents with  . Follow-up        Subjective:   Bianca Henderson is a 69 y.o. female here today for a follow up visit. Patient is here complaining of having pain in both arches of both feet, but the right side is worse. Patient also c/o having pain over the right breast that is worse with movement. Patient also tripped yesterday and scratched her right leg with a nail. She requests to have a mammogram done. She states this was ordered last year but she never heard about an appointment. Her medical history includes hypertension and history of remote GI malignancy about 15 years ago, patient is not sure if it was colon or stomach but she remembered having surgery but no chemotherapy or radiation. Patient does not smoke cigarettes, she does not drink alcohol. Patient has No headache, No chest pain, No abdominal pain - No Nausea, No new weakness tingling or numbness, No Cough - SOB.  Problem  Leg Pain, Bilateral  Breast Cancer Screening, High Risk Patient  Colon Cancer Screening  Family History of GI Malignancy  GI Malignancy    ALLERGIES: Allergies  Allergen Reactions  . Septra [Sulfamethoxazole-Trimethoprim] Itching and Rash    PAST MEDICAL HISTORY: Past Medical History  Diagnosis Date  . Hypotension   . Cancer     instestines  . Pneumonia 11/26/2013  . Pain in lower limb     calf pain  in both legs    MEDICATIONS AT HOME: Prior to Admission medications   Medication Sig Start Date End Date Taking? Authorizing Provider  acetaminophen (TYLENOL) 500 MG tablet Take 1,000 mg by mouth every 8 (eight) hours as needed for moderate pain.    Historical Provider, MD  guaiFENesin (MUCINEX) 600 MG 12 hr tablet Take 2 tablets (1,200 mg total) by mouth 2 (two) times daily. Patient not taking: Reported on  12/08/2014 11/27/13   Jonetta Osgood, MD  naproxen (NAPROSYN) 500 MG tablet Take 1 tablet (500 mg total) by mouth 2 (two) times daily with a meal. 12/08/14   Tresa Garter, MD     Objective:   Filed Vitals:   12/08/14 1501  BP: 142/83  Pulse: 83  Temp: 98.1 F (36.7 C)  TempSrc: Oral  Height: 4\' 11"  (1.499 m)  Weight: 145 lb (65.772 kg)  SpO2: 97%    Exam General appearance : Awake, alert, not in any distress. Speech Clear. Not toxic looking HEENT: Atraumatic and Normocephalic, pupils equally reactive to light and accomodation Neck: supple, no JVD. No cervical lymphadenopathy.  Chest:Good air entry bilaterally, no added sounds  CVS: S1 S2 regular, no murmurs.  Abdomen: Bowel sounds present, Non tender and not distended with no gaurding, rigidity or rebound. Extremities: B/L Lower Ext shows no edema, both legs are warm to touch Neurology: Awake alert, and oriented X 3, CN II-XII intact, Non focal Skin:No Rash Wounds:  Data Review Lab Results  Component Value Date   HGBA1C 5.4 11/04/2013   HGBA1C 5.7* 06/03/2013     Assessment & Plan   1. Leg pain, bilateral  - naproxen (NAPROSYN) 500 MG tablet; Take 1 tablet (500 mg total) by mouth 2 (two) times daily with a meal.  Dispense: 30 tablet; Refill: 0  - Vitamin B12 - CBC with Differential/Platelet -  COMPLETE METABOLIC PANEL WITH GFR - POCT glycosylated hemoglobin (Hb A1C) - Lipid panel - TSH - Vit D  25 hydroxy (rtn osteoporosis monitoring)  2. Breast cancer screening, high risk patient  - MM Digital Screening; Future  3. Colon cancer screening  - HM COLONOSCOPY - Ambulatory referral to Gastroenterology  4. Hx of GI malignancy  - HM COLONOSCOPY - Ambulatory referral to Gastroenterology   Patient was counseled extensively about nutrition and exercise  Interpreter was used to communicate directly with patient for the entire encounter including providing detailed patient instructions.   Return in  about 3 months (around 03/08/2015), or if symptoms worsen or fail to improve, for Follow up Pain and comorbidities.  The patient was given clear instructions to go to ER or return to medical center if symptoms don't improve, worsen or new problems develop. The patient verbalized understanding. The patient was told to call to get lab results if they haven't heard anything in the next week.   This note has been created with Surveyor, quantity. Any transcriptional errors are unintentional.    Angelica Chessman, MD, [place], [place], [place], [place]   12/08/2014, 4:01 PM

## 2014-12-09 LAB — VITAMIN D 25 HYDROXY (VIT D DEFICIENCY, FRACTURES): Vit D, 25-Hydroxy: 9 ng/mL — ABNORMAL LOW (ref 30–100)

## 2014-12-12 ENCOUNTER — Telehealth: Payer: Self-pay | Admitting: *Deleted

## 2014-12-12 MED ORDER — VITAMIN D (ERGOCALCIFEROL) 1.25 MG (50000 UNIT) PO CAPS: 50000.0000 [IU] | ORAL_CAPSULE | ORAL | Status: DC

## 2014-12-12 MED ORDER — GEMFIBROZIL 600 MG PO TABS: 600.0000 mg | ORAL_TABLET | Freq: Two times a day (BID) | ORAL | Status: DC

## 2014-12-12 NOTE — Telephone Encounter (Signed)
Rx send to [place] Pt aware of results (results given in Spanish)      Notes Recorded by Tresa Garter, MD on 12/10/2014 at 5:21 PM Please inform patient that her laboratory test results are mostly within normal limits except for the following: 1. High cholesterol level: To address this please limit saturated fat to no more than 7% of your calories, limit cholesterol to 200 mg/day, increase fiber and exercise as tolerated. We will also start her on cholesterol-lowering medication. 2. Vitamin D level is very low and needs to be replaced with vitamin D supplements.  Please call in prescription: Gemfibrozil 600 mg tablet by mouth twice a day, 60 tablets with 3 refills. Vitamin D ergocalciferol 50,000 units, 1 capsule weekly for 12 weeks.

## 2014-12-16 ENCOUNTER — Telehealth: Payer: Self-pay | Admitting: *Deleted

## 2014-12-16 ENCOUNTER — Other Ambulatory Visit: Payer: Self-pay | Admitting: Emergency Medicine

## 2014-12-16 ENCOUNTER — Other Ambulatory Visit: Payer: Self-pay | Admitting: *Deleted

## 2014-12-16 MED ORDER — GEMFIBROZIL 600 MG PO TABS: 600.0000 mg | ORAL_TABLET | Freq: Two times a day (BID) | ORAL | Status: DC

## 2014-12-16 MED ORDER — VITAMIN D (ERGOCALCIFEROL) 1.25 MG (50000 UNIT) PO CAPS: 50000.0000 [IU] | ORAL_CAPSULE | ORAL | Status: DC

## 2014-12-16 NOTE — Telephone Encounter (Signed)
error 

## 2015-03-11 ENCOUNTER — Ambulatory Visit: Payer: No Typology Code available for payment source

## 2015-03-27 ENCOUNTER — Ambulatory Visit: Payer: Medicaid Other | Attending: Internal Medicine

## 2015-09-25 ENCOUNTER — Ambulatory Visit: Payer: Medicaid Other | Attending: Internal Medicine

## 2016-04-01 ENCOUNTER — Ambulatory Visit: Payer: Medicaid Other | Attending: Internal Medicine

## 2016-07-18 ENCOUNTER — Encounter (HOSPITAL_COMMUNITY): Payer: Self-pay | Admitting: *Deleted

## 2016-07-18 DIAGNOSIS — N644 Mastodynia: Secondary | ICD-10-CM | POA: Insufficient documentation

## 2016-07-18 DIAGNOSIS — Z85038 Personal history of other malignant neoplasm of large intestine: Secondary | ICD-10-CM | POA: Insufficient documentation

## 2016-07-18 NOTE — ED Triage Notes (Addendum)
Pt c/o bilateral breast pain x 10 days. Denies chest pain. 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

## 2016-07-19 ENCOUNTER — Emergency Department (HOSPITAL_COMMUNITY)
Admission: EM | Admit: 2016-07-19 | Discharge: 2016-07-19 | Disposition: A | Discharge status: Home / Self Care | Payer: Medicaid Other | Attending: Physician Assistant | Admitting: Physician Assistant

## 2016-07-19 DIAGNOSIS — N644 Mastodynia: Secondary | ICD-10-CM

## 2016-07-19 MED ORDER — IBUPROFEN 400 MG PO TABS: 400.0000 mg | ORAL_TABLET | Freq: Four times a day (QID) | ORAL | 0 refills | Status: DC | PRN

## 2016-07-19 MED ORDER — IBUPROFEN 400 MG PO TABS: 600.0000 mg | ORAL_TABLET | Freq: Once | ORAL | Status: DC

## 2016-07-19 NOTE — ED Provider Notes (Signed)
[facility] DEPT Provider Note   CSN: [id number] Arrival date & time: 07/18/16  1953     History   Chief Complaint Chief Complaint  Patient presents with  . Breast Pain    HPI Bianca Henderson is a 70 y.o. female.  This a very pleasant 70 year old Hispanic female who presents to the emergency department with a week plus of bilateral breast tenderness and stinging sensation in her nipples.  This started happening shortly after she found out her sister had breast cancer and was undergoing chemotherapy.  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  She states she did speak with her primary care doctor last year about a mammogram because of a very strong family history of breast cancer and was told due to her age.  She does not require Pap smears, no regular mammograms. Patient states that she has not taken anything for her discomfort.  Apply warm compresses or cool compresses.  Denies any nipple discharge, change in the skin redness, shortness of breath      Past Medical History:  Diagnosis Date  . Cancer ([place])    instestines  . Hypotension   . Pain in lower limb    calf pain  in both legs  . Pneumonia 11/26/2013    Patient Active Problem List   Diagnosis Date Noted  . Leg pain, bilateral 12/08/2014  . Breast cancer screening, high risk patient 12/08/2014  . Colon cancer screening 12/08/2014  . Family history of GI malignancy 12/08/2014  . GI malignancy ([place]) 12/08/2014  . CAP (community acquired pneumonia) 11/24/2013  . Leukocytosis 11/24/2013  . Hyponatremia 11/24/2013  . Pap smear for cervical cancer screening 11/04/2013  . Dyslipidemia 11/04/2013  . Carpal tunnel syndrome 07/11/2013  . Encounter to establish care 06/03/2013  . Dental caries 06/03/2013    Past Surgical History:  Procedure Laterality Date  . APPENDECTOMY    . SMALL INTESTINE SURGERY  2002   remove cancer    OB History    No data  available       Home Medications    Prior to Admission medications   Medication Sig Start Date End Date Taking? Authorizing Provider  acetaminophen (TYLENOL) 500 MG tablet Take 1,000 mg by mouth every 8 (eight) hours as needed for moderate pain.    Historical Provider, MD  gemfibrozil (LOPID) 600 MG tablet Take 1 tablet (600 mg total) by mouth 2 (two) times daily before a meal. 12/16/14   Tresa Garter, MD  guaiFENesin (MUCINEX) 600 MG 12 hr tablet Take 2 tablets (1,200 mg total) by mouth 2 (two) times daily. Patient not taking: Reported on 12/08/2014 11/27/13   Jonetta Osgood, MD  ibuprofen (ADVIL,MOTRIN) 400 MG tablet Take 1 tablet (400 mg total) by mouth every 6 (six) hours as needed. 07/19/16   Junius Creamer, NP  naproxen (NAPROSYN) 500 MG tablet Take 1 tablet (500 mg total) by mouth 2 (two) times daily with a meal. 12/08/14   Tresa Garter, MD  Vitamin D, Ergocalciferol, (DRISDOL) 50000 UNITS CAPS capsule Take 1 capsule (50,000 Units total) by mouth every 7 (seven) days. 12/16/14   Tresa Garter, MD    Family History No family history on file.  Social History Social History  Substance Use Topics  . Smoking status: Never Smoker  . Smokeless tobacco: Never Used  . Alcohol use No     Allergies   Septra [sulfamethoxazole-trimethoprim]   Review  of Systems Review of Systems  Constitutional: Negative.   HENT: Negative.   Respiratory: Negative.   Cardiovascular: Negative.   Gastrointestinal: Negative.   Genitourinary: Negative.   Musculoskeletal: Negative.   Skin: Negative for color change, pallor, rash and wound.  Neurological: Negative for weakness.  All other systems reviewed and are negative.    Physical Exam Updated Vital Signs BP 114/68   Pulse 63   Temp 98.8 F (37.1 C) (Oral)   Resp 18   Wt 62.1 kg   SpO2 98%   BMI 27.67 kg/m   Physical Exam  Constitutional: She appears well-developed and well-nourished.  Eyes: Pupils are equal, round,  and reactive to light.  Neck: Normal range of motion.  Cardiovascular: Normal rate.   Pulmonary/Chest: Effort normal. Right breast exhibits tenderness. Right breast exhibits no inverted nipple, no mass, no nipple discharge and no skin change. Left breast exhibits tenderness. Left breast exhibits no inverted nipple, no mass, no nipple discharge and no skin change. Breasts are symmetrical.  Abdominal: Soft.  Genitourinary: No breast discharge or bleeding.  Nursing note and vitals reviewed.    ED Treatments / Results  Labs (all labs ordered are listed, but only abnormal results are displayed) Labs Reviewed - No data to display  EKG  EKG Interpretation None       Radiology No results found.  Procedures Procedures (including critical care time)  Medications Ordered in ED Medications  ibuprofen (ADVIL,MOTRIN) tablet 600 mg (not administered)     Initial Impression / Assessment and Plan / ED Course  I have reviewed the triage vital signs and the nursing notes.  Pertinent labs & imaging results that were available during my care of the patient were reviewed by me and considered in my medical decision making (see chart for details).  Clinical Course     I see no sign of breast mass, nodule, there is no nipple discharge.  There is no erythema.  The breasts are symmetrical in size and shape.  There is no dimpling of the skin.  No enlarged lymph nodes in the axilla  Final Clinical Impressions(s) / ED Diagnoses   Final diagnoses:  Breast pain    New Prescriptions New Prescriptions   IBUPROFEN (ADVIL,MOTRIN) 400 MG TABLET    Take 1 tablet (400 mg total) by mouth every 6 (six) hours as needed.     Junius Creamer, NP 07/19/16 0143    Junius Creamer, NP 07/19/16 0148    Courteney Julio Alm, MD 07/19/16 [id number]

## 2016-07-19 NOTE — ED Notes (Signed)
Pt d/c home, ambulatory.

## 2016-07-19 NOTE — Discharge Instructions (Signed)
esta noche le examinaron para la sensibilidad del pecho y la sensacin del hormigueo en sus pezones. No encuentro masas ni bultos, ni descarga de pezones, ni enrojecimiento. No hay ganglios linfticos agrandados. No hay signo o sntoma de ninguna infeccin. Se le ha dado una receta para el ibuprofeno. Usted puede tomar [employer] base regular durante los prximos [place], cada 6 horas segn sea necesario para Health and safety inspector. Le recomiendo que haga una cita con su mdico de atencin primaria para discutir las nuevas circunstancias en su vida con su hermana que tiene cncer de mama. Esto se conoce como pariente de Neurosurgeon, y en este momento se recomienda que reciba una mamografa de deteccin.  tonight you were examined for breast tenderness and tingling sensation in your nipples.  I find no masses/lumps, no nipple discharge, no redness.  No enlarged lymph nodes.  No sign or symptom of any infection.  You have been given a prescription for ibuprofen.  You can take this on a regular basis for the next several days, every 6 hours as needed for discomfort. I recommend that she make an appointment with your primary care doctor to discuss the new circumstances in your life with your sister having breast cancer.  This is called a first degree relative, and at this time it would be recommended that you do receive a screening mammogram.

## 2016-08-04 ENCOUNTER — Encounter: Payer: Self-pay | Admitting: Internal Medicine

## 2016-08-04 ENCOUNTER — Ambulatory Visit: Payer: Medicaid Other | Attending: Internal Medicine | Admitting: Internal Medicine

## 2016-08-04 VITALS — BP 101/53 | HR 64 | Temp 98.3°F | Resp 18 | Ht 59.0 in | Wt 140.0 lb

## 2016-08-04 DIAGNOSIS — Z1239 Encounter for other screening for malignant neoplasm of breast: Secondary | ICD-10-CM

## 2016-08-04 DIAGNOSIS — Z8589 Personal history of malignant neoplasm of other organs and systems: Secondary | ICD-10-CM | POA: Insufficient documentation

## 2016-08-04 DIAGNOSIS — Z1231 Encounter for screening mammogram for malignant neoplasm of breast: Secondary | ICD-10-CM

## 2016-08-04 DIAGNOSIS — N644 Mastodynia: Secondary | ICD-10-CM | POA: Insufficient documentation

## 2016-08-04 DIAGNOSIS — Z79899 Other long term (current) drug therapy: Secondary | ICD-10-CM | POA: Insufficient documentation

## 2016-08-04 DIAGNOSIS — Z1211 Encounter for screening for malignant neoplasm of colon: Secondary | ICD-10-CM

## 2016-08-04 MED ORDER — DICLOFENAC SODIUM 1 % TD GEL: 4.0000 g | Freq: Four times a day (QID) | TRANSDERMAL | 0 refills | Status: DC

## 2016-08-04 NOTE — Patient Instructions (Signed)
Mamografa (Mammogram) Bianca Henderson es un radiografa de las mamas que se realiza para determinar si hay cambios anormales. Este procedimiento permite explorar y Actuary cambio que pudiera sugerir la presencia de cncer de mama. La mamografa tambin puede identificar otros cambios y variaciones en las [place], por ejemplo:  La inflamacin del tejido mamario (mastitis).  Una zona infectada que contiene una acumulacin de pus (absceso).  Una cavidad llena de lquido (quiste).  Cambios fibroqusticos. Estos ocurren cuando el tejido [employer] se vuelve ms denso, lo que, a la palpacin, puede hacer que se perciba como una cuerda o una superficie irregular debajo de la piel.  Tumores no cancerosos (benignos). INFORME A SU MDICO:  Cualquier alergia que tenga.  Si tiene implantes mamarios.  Si ha tenido enfermedades, biopsias o cirugas previas de Scientist, research (medical).  Si est amamantando.  Cualquier posibilidad de que estuviera embarazada, si corresponde.  Si es menor de 25aos.  Si tiene antecedentes familiares de cncer de mama. RIESGOS Y COMPLICACIONES En general, se trata de un procedimiento seguro. Sin embargo, pueden presentarse problemas, por ejemplo:  Exposicin a la radiacin. En [employer], los [place] de radiacin son muy bajos.  La interpretacin [employer].  La necesidad de Optometrist ms [employer].  La imposibilidad de la mamografa de Hydrographic surveyor algunos tipos de cncer. ANTES DEL PROCEDIMIENTO  Programe el estudio para hacrselo aproximadamente 1 o 2semanas despus de la [place]. Generalmente, este es el momento en que las mamas estn menos sensibles.  Si se hizo Musician en un centro diferente en el pasado, trate de conseguirla o solicite que la enven al nuevo centro de estudios con el fin de compararlas.  El da del estudio, lvese las [place] y [place].  No use desodorantes, perfumes, lociones o talcos en ningn lugar del cuerpo el  da del [place].  Qutese las alhajas del cuello.  Use prendas que pueda ponerse y sacarse fcilmente. PROCEDIMIENTO  Tendr que desvestirse de la cintura para [place] y ponerse una bata de hospital.  Debe permanecer de pie delante de la mquina de rayos-X.  Se colocar cada mama entre dos placas de [place] unos segundos. Durante el procedimiento, intente estar lo ms relajada posible. Esto no causa ningn dao a las [place], y, si siente [employer], ser pasajera.  Se tomarn radiografas desde diferentes ngulos de cada mama. Este procedimiento puede variar segn el mdico y el hospital. DESPUS DEL PROCEDIMIENTO  La mamografa ser examinada por un especialista (radilogo).  En funcin de la calidad de [employer], es posible que tenga que repetir algunas partes del [place]. Generalmente, esto se realiza si el radilogo necesita una vista mejor del tejido [place].  Pregunte la fecha en que los resultados estarn disponibles. Asegrese de [employer].  Puede reanudar sus actividades normales.   Esta informacin no tiene Marine scientist el consejo del mdico. Asegrese de hacerle al mdico cualquier pregunta que tenga.   Document Released: 07/13/2005 Document Revised: 06/24/2015 Elsevier Interactive Patient Education [employer].

## 2016-08-04 NOTE — Progress Notes (Signed)
Bianca Henderson, is a 70 y.o. female  [id number]  [id number]  DOB - Mar 02, 1946  Chief Complaint  Patient presents with  . Breast Pain      Subjective:   Bianca Henderson is a 70 y.o. 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 She is requesting mammogram after learning that her sister is currently diagnosed with breast cancer. She has 2 currently active mammogram ordered in 2015 and 2016, patient reportedly did not go for either appointments for unclear reasons. She promise to go for the appointments this time. She presented to the ED 2 weeks ago complaining of bilateral breast tenderness and stinging sensation in her nipples which started happening shortly after she found out her sister had breast cancer and was undergoing chemotherapy. Today she has no new complaint. No lump. No discharge.  ALLERGIES: Allergies  Allergen Reactions  . Septra [Sulfamethoxazole-Trimethoprim] Itching and Rash    PAST MEDICAL HISTORY: Past Medical History:  Diagnosis Date  . Cancer ([place])    instestines  . Hypotension   . Pain in lower limb    calf pain  in both legs  . Pneumonia 11/26/2013    MEDICATIONS AT HOME: Prior to Admission medications   Medication Sig Start Date End Date Taking? Authorizing Provider  acetaminophen (TYLENOL) 500 MG tablet Take 1,000 mg by mouth every 8 (eight) hours as needed for moderate pain.    Historical Provider, MD  diclofenac sodium (VOLTAREN) 1 % GEL Apply 4 g topically 4 (four) times daily. 08/04/16   Tresa Garter, MD  gemfibrozil (LOPID) 600 MG tablet Take 1 tablet (600 mg total) by mouth 2 (two) times daily before a meal. 12/16/14   Tresa Garter, MD  ibuprofen (ADVIL,MOTRIN) 400 MG tablet Take 1 tablet (400 mg total) by mouth every 6 (six) hours as needed. 07/19/16   Junius Creamer, NP    Objective:   Vitals:   08/04/16  1608  BP: (!) 101/53  Pulse: 64  Resp: 18  Temp: 98.3 F (36.8 C)  TempSrc: Oral  SpO2: 97%  Weight: 140 lb (63.5 kg)  Height: 4\' 11"  (1.499 m)   Exam General appearance : Awake, alert, not in any distress. Speech Clear. Not toxic looking HEENT: Atraumatic and Normocephalic, pupils equally reactive to light and accomodation Neck: Supple, no JVD. No cervical lymphadenopathy.  Chest: Good air entry bilaterally, no added sounds  CVS: S1 S2 regular, no murmurs.  Abdomen: Bowel sounds present, Non tender and not distended with no gaurding, rigidity or rebound. Extremities: B/L Lower Ext shows no edema, both legs are warm to touch Neurology: Awake alert, and oriented X 3, CN II-XII intact, Non focal Skin: No Rash  Data Review Lab Results  Component Value Date   HGBA1C 5.60 12/08/2014   HGBA1C 5.4 11/04/2013   HGBA1C 5.7 (H) 06/03/2013    Assessment & Plan   1. Breast cancer screening, high risk patient  - MM Digital Diagnostic Bilat; Future  2. Colon cancer screening  - Ambulatory referral to Gastroenterology  Patient have been counseled extensively about nutrition and exercise. .   Interpreter was used to communicate directly with patient for the entire encounter including providing detailed patient instructions.   Return in about 6 months (around 02/02/2017) for Follow up Pain and comorbidities, Annual Physical.  The patient was given clear instructions to go to  ER or return to medical center if symptoms don't improve, worsen or new problems develop. The patient verbalized understanding. The patient was told to call to get lab results if they haven't heard anything in the next week.   This note has been created with Surveyor, quantity. Any transcriptional errors are unintentional.    Angelica Chessman, MD, [place], [place], [place], [place] and [place], [place]   08/04/2016, 4:46  PM

## 2016-08-04 NOTE — Progress Notes (Signed)
Patient is here for Mammogram referral  Patient has bilateral breast pain in the center of her breast with no nipple pain or discharge. Patient states sister is in [place] with breast cancer right now and patient had intestinal cancer and was treated in [place].  Patient has taken medication today and patient has eaten today.

## 2016-08-05 ENCOUNTER — Encounter: Payer: Self-pay | Admitting: Internal Medicine

## 2016-08-11 ENCOUNTER — Ambulatory Visit
Admission: RE | Admit: 2016-08-11 | Discharge: 2016-08-11 | Disposition: A | Discharge status: Home / Self Care | Payer: Medicaid Other | Source: Ambulatory Visit | Attending: Internal Medicine | Admitting: Internal Medicine

## 2016-08-11 DIAGNOSIS — N644 Mastodynia: Secondary | ICD-10-CM

## 2016-08-12 ENCOUNTER — Telehealth: Payer: Self-pay | Admitting: *Deleted

## 2016-08-12 NOTE — Telephone Encounter (Signed)
Medical Assistant used [employer] to contact patient.  Interpreter Name: Clayborn Heron #: [id number] Patient verified DOB Patient is aware of mammogram showing no evidence of malignancy and a recommend another one be completed in one year. Patient expressed her understanding and had no further questions at this time.

## 2016-08-12 NOTE — Telephone Encounter (Signed)
-----   Message from Tresa Garter, MD sent at 08/11/2016  1:04 PM EDT ----- Please inform patient that her screening mammogram shows no evidence of malignancy. Recommend screening mammogram in one year

## 2016-08-25 ENCOUNTER — Ambulatory Visit (AMBULATORY_SURGERY_CENTER): Payer: Self-pay | Admitting: *Deleted

## 2016-08-25 VITALS — Ht 59.0 in | Wt 139.0 lb

## 2016-08-25 DIAGNOSIS — Z1211 Encounter for screening for malignant neoplasm of colon: Secondary | ICD-10-CM

## 2016-08-25 NOTE — Progress Notes (Signed)
No egg or soy allergy known to patient  No issues with past sedation with any surgeries  or procedures, no intubation problems  No diet pills per patient No home 02 use per patient  No blood thinners per patient  Pt denies issues with constipation presently but has had in the past  No A fib or A flutter   instructions given in spanish- discussed with interpreter in [place] today --  Questions answered

## 2016-09-12 ENCOUNTER — Encounter: Payer: Self-pay | Admitting: Internal Medicine

## 2016-09-12 ENCOUNTER — Ambulatory Visit (AMBULATORY_SURGERY_CENTER): Payer: Medicaid Other | Admitting: Internal Medicine

## 2016-09-12 VITALS — BP 80/38 | HR 57 | Temp 98.0°F | Resp 11 | Ht 59.0 in | Wt 139.0 lb

## 2016-09-12 DIAGNOSIS — Z85038 Personal history of other malignant neoplasm of large intestine: Secondary | ICD-10-CM

## 2016-09-12 DIAGNOSIS — Z1211 Encounter for screening for malignant neoplasm of colon: Secondary | ICD-10-CM

## 2016-09-12 DIAGNOSIS — Z1212 Encounter for screening for malignant neoplasm of rectum: Secondary | ICD-10-CM

## 2016-09-12 DIAGNOSIS — Z8500 Personal history of malignant neoplasm of unspecified digestive organ: Secondary | ICD-10-CM

## 2016-09-12 MED ORDER — SODIUM CHLORIDE 0.9 % IV SOLN: 500.0000 mL | INTRAVENOUS | Status: AC

## 2016-09-12 NOTE — Op Note (Signed)
[facility] Patient Name: Bianca Henderson Procedure Date: 09/12/2016 2:26 PM MRN: [id number] Endoscopist: Gatha Mayer , MD Age: 70 Referring MD:  Date of Birth: 06/29/1946 Gender: Female Account #: [id number] Procedure:                Colonoscopy Indications:              High risk colon cancer surveillance: Personal                            history of colon cancer Medicines:                Propofol per Anesthesia, Monitored Anesthesia Care Procedure:                Pre-Anesthesia Assessment:                           - Prior to the procedure, a History and Physical                            was performed, and patient medications and                            allergies were reviewed. The patient's tolerance of                            previous anesthesia was also reviewed. The risks                            and benefits of the procedure and the sedation                            options and risks were discussed with the patient.                            All questions were answered, and informed consent                            was obtained. Prior Anticoagulants: The patient has                            taken no previous anticoagulant or antiplatelet                            agents. ASA Grade Assessment: II - A patient with                            mild systemic disease. After reviewing the risks                            and benefits, the patient was deemed in                            satisfactory condition to undergo the procedure.  After obtaining informed consent, the colonoscope                            was passed under direct vision. Throughout the                            procedure, the patient's blood pressure, pulse, and                            oxygen saturations were monitored continuously. The                            Model CF-HQ190L [id number]) scope was introduced                            through the anus  and advanced to the the cecum,                            identified by appendiceal orifice and ileocecal                            valve. The colonoscopy was performed without                            difficulty. The patient tolerated the procedure                            well. The quality of the bowel preparation was                            excellent. The rectum and Ileocolonic anastomsis                            areas were photographed. Scope In: 2:46:05 PM Scope Out: 2:52:30 PM Scope Withdrawal Time: 0 hours 4 minutes 13 seconds  Total Procedure Duration: 0 hours 6 minutes 25 seconds  Findings:                 The perianal and digital rectal examinations were                            normal.                           There was evidence of a prior end-to-side                            ileo-colonic anastomosis in the transverse colon.                            This was patent and was characterized by healthy                            appearing mucosa. The anastomosis was not traversed.  The exam was otherwise without abnormality on                            direct and retroflexion views. Complications:            No immediate complications. Estimated Blood Loss:     Estimated blood loss: none. Impression:               - Patent end-to-side ileo-colonic anastomosis,                            characterized by healthy appearing mucosa.                           - The examination was otherwise normal on direct                            and retroflexion views.                           - No specimens collected. Recommendation:           - Patient has a contact number available for                            emergencies. The signs and symptoms of potential                            delayed complications were discussed with the                            patient. Return to normal activities tomorrow.                            Written discharge  instructions were provided to the                            patient.                           - Resume previous diet.                           - Continue present medications.                           - Repeat colonoscopy in 5 years for screening                            purposes. Gatha Mayer, MD 09/12/2016 3:11:49 PM This report has been signed electronically.

## 2016-09-12 NOTE — Progress Notes (Signed)
  [facility] Post-op Note  Patient: Bianca Henderson  Procedure(s) Performed: colonoscopy  Patient Location: [facility] - Recovery Area  Anesthesia Type: Deep Sedation/Propofol  Level of Consciousness: awake, oriented and patient cooperative  Airway and Oxygen Therapy: Patient Spontanous Breathing  Post-op Pain: none  Post-op Assessment:  Post-op Vital signs reviewed, Patient's Cardiovascular Status Stable, Respiratory Function Stable, Patent Airway, No signs of Nausea or vomiting and Pain level controlled  Post-op Vital Signs: Reviewed and stable  Complications: No apparent anesthesia complications  Charlee Squibb E 2:59 PM

## 2016-09-12 NOTE — Progress Notes (Signed)
Patient is Spanish speaking only. Interpreter at bedside for assistance in her name, Bianca Henderson.

## 2016-09-12 NOTE — Patient Instructions (Addendum)
   No polyps or cancer seen. It seems that you have a history of colon cancer. Your next routine colonoscopy should be in 5 years - 2022.  Discharge instructions given. Normal exam. Resume previous medications. YOU HAD AN ENDOSCOPIC PROCEDURE TODAY AT [facility]:   Refer to the procedure report that was given to you for any specific questions about what was found during the examination.  If the procedure report does not answer your questions, please call your gastroenterologist to clarify.  If you requested that your care partner not be given the details of your procedure findings, then the procedure report has been included in a sealed envelope for you to review at your convenience later.  YOU SHOULD EXPECT: Some feelings of bloating in the abdomen. Passage of more gas than usual.  Walking can help get rid of the air that was put into your GI tract during the procedure and reduce the bloating. If you had a lower endoscopy (such as a colonoscopy or flexible sigmoidoscopy) you may notice spotting of blood in your stool or on the toilet paper. If you underwent a bowel prep for your procedure, you may not have a normal bowel movement for a few days.  Please Note:  You might notice some irritation and congestion in your nose or some drainage.  This is from the oxygen used during your procedure.  There is no need for concern and it should clear up in a day or so.  SYMPTOMS TO REPORT IMMEDIATELY:   Following lower endoscopy (colonoscopy or flexible sigmoidoscopy):  Excessive amounts of blood in the stool  Significant tenderness or worsening of abdominal pains  Swelling of the abdomen that is new, acute  Fever of 100F or higher   For urgent or emergent issues, a gastroenterologist can be reached at any hour by calling [phone].   DIET:  We do recommend a small meal at first, but then you may proceed to your regular diet.  Drink plenty of fluids but you should avoid  alcoholic beverages for 24 hours.  ACTIVITY:  You should plan to take it easy for the rest of today and you should NOT DRIVE or use heavy machinery until tomorrow (because of the sedation medicines used during the test).    FOLLOW UP: Our staff will call the number listed on your records the next business day following your procedure to check on you and address any questions or concerns that you may have regarding the information given to you following your procedure. If we do not reach you, we will leave a message.  However, if you are feeling well and you are not experiencing any problems, there is no need to return our call.  We will assume that you have returned to your regular daily activities without incident.  If any biopsies were taken you will be contacted by phone or by letter within the next 1-3 weeks.  Please call us at [phone] if you have not heard about the biopsies in 3 weeks.    SIGNATURES/CONFIDENTIALITY: You and/or your care partner have signed paperwork which will be entered into your electronic medical record.  These signatures attest to the fact that that the information above on your After Visit Summary has been reviewed and is understood.  Full responsibility of the confidentiality of this discharge information lies with you and/or your care-partner.

## 2016-09-13 ENCOUNTER — Telehealth: Payer: Self-pay | Admitting: *Deleted

## 2016-09-13 NOTE — Telephone Encounter (Signed)
  Follow up Call-  Call back number 09/12/2016  Post procedure Call Back phone  # [phone]  Permission to leave phone message Yes  Some recent data might be hidden     Patient questions:  Do you have a fever, pain , or abdominal swelling? No. Pain Score  0 *  Have you tolerated food without any problems? Yes.    Have you been able to return to your normal activities? Yes.    Do you have any questions about your discharge instructions: Diet   No. Medications  No. Follow up visit  No.  Do you have questions or concerns about your Care? No.  Actions: * If pain score is 4 or above: No action needed, pain <4.   She knew enough [place] and I knew enough Spanish to get through the questions.

## 2016-09-19 ENCOUNTER — Ambulatory Visit: Payer: Self-pay | Attending: Internal Medicine

## 2016-11-25 ENCOUNTER — Ambulatory Visit: Payer: Self-pay | Attending: Physician Assistant | Admitting: Physician Assistant

## 2016-11-25 ENCOUNTER — Encounter: Payer: Self-pay | Admitting: Physician Assistant

## 2016-11-25 VITALS — BP 125/74 | HR 72 | Temp 97.8°F | Resp 16 | Wt 136.2 lb

## 2016-11-25 DIAGNOSIS — R8281 Pyuria: Secondary | ICD-10-CM

## 2016-11-25 DIAGNOSIS — R52 Pain, unspecified: Secondary | ICD-10-CM

## 2016-11-25 DIAGNOSIS — M722 Plantar fascial fibromatosis: Secondary | ICD-10-CM | POA: Insufficient documentation

## 2016-11-25 DIAGNOSIS — M549 Dorsalgia, unspecified: Secondary | ICD-10-CM | POA: Insufficient documentation

## 2016-11-25 DIAGNOSIS — L719 Rosacea, unspecified: Secondary | ICD-10-CM | POA: Insufficient documentation

## 2016-11-25 DIAGNOSIS — J45909 Unspecified asthma, uncomplicated: Secondary | ICD-10-CM | POA: Insufficient documentation

## 2016-11-25 DIAGNOSIS — N390 Urinary tract infection, site not specified: Secondary | ICD-10-CM | POA: Insufficient documentation

## 2016-11-25 DIAGNOSIS — Z79899 Other long term (current) drug therapy: Secondary | ICD-10-CM | POA: Insufficient documentation

## 2016-11-25 LAB — COMPREHENSIVE METABOLIC PANEL
ALT: 11 U/L (ref 6–29)
AST: 16 U/L (ref 10–35)
Albumin: 4.1 g/dL (ref 3.6–5.1)
Alkaline Phosphatase: 65 U/L (ref 33–130)
BUN: 18 mg/dL (ref 7–25)
CO2: 27 mmol/L (ref 20–31)
Calcium: 9 mg/dL (ref 8.6–10.4)
Chloride: 106 mmol/L (ref 98–110)
Creat: 0.73 mg/dL (ref 0.50–0.99)
GLUCOSE: 105 mg/dL — AB (ref 65–99)
POTASSIUM: 4.1 mmol/L (ref 3.5–5.3)
SODIUM: 139 mmol/L (ref 135–146)
TOTAL PROTEIN: 7 g/dL (ref 6.1–8.1)
Total Bilirubin: 0.5 mg/dL (ref 0.2–1.2)

## 2016-11-25 LAB — CBC WITH DIFFERENTIAL/PLATELET
BASOS ABS: 82 {cells}/uL (ref 0–200)
Basophils Relative: 1 %
EOS PCT: 3 %
Eosinophils Absolute: 246 cells/uL (ref 15–500)
HCT: 40.7 % (ref 35.0–45.0)
Hemoglobin: 13.5 g/dL (ref 11.7–15.5)
LYMPHS PCT: 27 %
Lymphs Abs: 2214 cells/uL (ref 850–3900)
MCH: 30.5 pg (ref 27.0–33.0)
MCHC: 33.2 g/dL (ref 32.0–36.0)
MCV: 92.1 fL (ref 80.0–100.0)
MONOS PCT: 9 %
MPV: 10.7 fL (ref 7.5–12.5)
Monocytes Absolute: 738 cells/uL (ref 200–950)
NEUTROS ABS: 4920 {cells}/uL (ref 1500–7800)
Neutrophils Relative %: 60 %
PLATELETS: 240 10*3/uL (ref 140–400)
RBC: 4.42 MIL/uL (ref 3.80–5.10)
RDW: 13.7 % (ref 11.0–15.0)
WBC: 8.2 10*3/uL (ref 3.8–10.8)

## 2016-11-25 LAB — POCT URINALYSIS DIPSTICK
Bilirubin, UA: NEGATIVE
Glucose, UA: NEGATIVE
Ketones, UA: NEGATIVE
NITRITE UA: NEGATIVE
PH UA: 5.5
PROTEIN UA: NEGATIVE
Spec Grav, UA: 1.025
Urobilinogen, UA: 0.2

## 2016-11-25 MED ORDER — METRONIDAZOLE 0.75 % EX GEL: 1.0000 "application " | Freq: Two times a day (BID) | CUTANEOUS | 0 refills | Status: AC

## 2016-11-25 MED ORDER — MELOXICAM 15 MG PO TABS: 15.0000 mg | ORAL_TABLET | Freq: Every day | ORAL | 0 refills | Status: DC

## 2016-11-25 MED ORDER — VITAMIN D-3 125 MCG (5000 UT) PO TABS: 1.0000 | ORAL_TABLET | Freq: Every day | ORAL | 0 refills | Status: AC

## 2016-11-25 MED ORDER — CIPROFLOXACIN HCL 500 MG PO TABS
500.0000 mg | ORAL_TABLET | Freq: Two times a day (BID) | ORAL | 0 refills | Status: AC
Start: 2016-11-25 — End: 2016-11-30

## 2016-11-25 NOTE — Patient Instructions (Addendum)
Por favor regrese en 2 semanas para seguimiento. Compre las plantillas de plantar fasciitis en la tendia. Empieze las pastillas de la vitamina D diarias.  Roscea (Rosacea) La roscea es una enfermedad a largo plazo (crnica) que afecta la piel del rostro, incluidas las [place], la [place], las cejas y IT consultant. Tambin [employer]. La enfermedad causa el agrandamiento de los vasos sanguneos que se encuentran cerca de la superficie de la piel, lo que produce enrojecimiento. CAUSAS Se desconoce la causa de esta afeccin. Determinados factores desencadenantes pueden empeorar la roscea, entre ellos:  Los baos calientes.  La actividad fsica.  La luz del sol.  Las temperaturas muy altas o muy bajas.  Los alimentos y las bebidas calientes o picantes.  El consumo de alcohol.  El estrs.  Tomar medicamentos para la presin arterial.  El uso prolongado de corticoides tpicos en el rostro. FACTORES DE RIESGO Es ms probable que esta afeccin se manifieste en:  [place] 30aos.  Las mujeres.  Las personas de piel clara Suann Larry clara).  Las personas con antecedentes familiares de roscea. SNTOMAS Los sntomas de esta afeccin incluyen lo siguiente:  Enrojecimiento del rostro.  Bultos rojos o Engineer, technical sales.  Enrojecimiento y agrandamiento de Mudlogger.  Se ruboriza fcilmente.  Lneas rojas en la piel.  Irritacin o sensacin de ardor en los ojos.  Hinchazn de los prpados. DIAGNSTICO Esta afeccin se diagnostica mediante la historia clnica y un examen fsico. TRATAMIENTO No hay cura para esta enfermedad, pero el tratamiento puede ayudar a Chief Technology Officer los sntomas. El mdico puede recomendarle que consulte a un especialista en piel (dermatlogo). El tratamiento puede incluir lo siguiente:  Antibiticos que se aplican en la piel o se toman como comprimidos.  Tratamiento con lser para mejorar el aspecto de la piel.  Ciruga. Esto es  raro. El mdico tambin le recomendar la mejor forma de cuidarse la piel. Incluso despus de que la piel mejore, es probable que deba continuar con el tratamiento para evitar que la roscea regrese. INSTRUCCIONES PARA EL CUIDADO EN EL HOGAR Cuidado de la piel  Cudese la piel como se lo haya indicado el mdico. Tal vez le indiquen que haga lo siguiente:  Lvese la piel con suavidad dos o ms veces al da.  Use un jabn suave.  Use una pantalla o un protector solar con factor de proteccin (FPS) de30 o ms.  Use cosmticos suaves especiales para pieles sensibles.  Afitese con Donnal Debar elctrica en lugar de hacerlo con una hoja de afeitar. Estilo de vida   Intente llevar un registro de los alimentos que desencadenan la enfermedad. Evite los factores desencadenantes. Estos pueden incluir lo siguiente:  Las comidas muy condimentadas.  [place].  El [place].  Los lquidos calientes.  Los frutos secos.  El chocolate.  La sal yodada.  No beba alcohol.  Evite las temperaturas extremadamente bajas o altas.  Intente bajar el nivel de estrs. Hable con el mdico si necesita ayuda para hacerlo.  Cuando realice actividad fsica, haga lo siguiente para mantenerse fresco:  Limite la exposicin al sol.  Use un ventilador.  Haga actividad fsica a intervalos ms cortos y ms frecuentes. Instrucciones generales   Concurra a todas las visitas de control como se lo haya indicado el mdico. Esto es importante.  Tome los medicamentos de venta libre y los recetados solamente como se lo haya indicado el mdico.  Si tiene los prpados afectados, aplquese compresas tibias. Hgalo como se  lo haya indicado el mdico.  Si le recetaron un antibitico, aplqueselo o tmelo como se lo haya indicado el mdico. No deje de usar el antibitico aunque la afeccin mejore. SOLICITE ATENCIN MDICA SI:  Los sntomas empeoran.  Los sntomas no mejoran despus de dos meses de  [place].  Aparecen nuevos sntomas.  Nota cambios en la visin o tiene [employer] ojos, como enrojecimiento o picazn.  Se siente deprimido.  Pierde el apetito.  Tiene dificultad para concentrarse. Esta informacin no tiene Marine scientist el consejo del mdico. Asegrese de hacerle al mdico cualquier pregunta que tenga. Document Released: 10/03/2005 Document Revised: 06/24/2015 Document Reviewed: 12/10/2014 Elsevier Interactive Patient Education  2017 Elsevier Inc. Fascitis plantar (Plantar Fasciitis) La fascitis plantar es una afeccin dolorosa que se produce en el taln. Ocurre cuando la banda de tejido que [employer] dedos con el hueso del taln (fascia plantar) se irrita. Esto puede ocurrir despus de Financial planner ejercicio u otras actividades repetitivas (lesin por uso excesivo). El dolor de la fascitis plantar puede ser de leve (irritacin) a intenso, y en los casos ms agudos puede dificultar que la persona camine o se [place]. Por lo general, el dolor es peor a la maana o despus de Public affairs consultant sentado o acostado durante un perodo. CAUSAS Este trastorno puede ser causado por:  Estar de pie durante largos perodos.  Usar zapatos que no calcen bien.  Practicar actividades de alto impacto, como correr, o hacer ejercicios [employer] o ballet.  Tener sobrepeso.  Tener una forma de caminar (marcha) anormal.  Tener los msculos de la pantorrilla tensos.  Tener el arco alto en los pies.  Comenzar una nueva actividad fsica. SNTOMAS El sntoma principal de esta afeccin es el dolor en el taln. Otros sntomas pueden ser los siguientes:  Dolor que empeora despus de una actividad o un ejercicio.  Dolor ms intenso a la maana o despus de Production assistant, radio.  Dolor que desaparece despus de caminar durante unos minutos. DIAGNSTICO Esta afeccin se puede diagnosticar en funcin de los signos y los sntomas. El mdico Environmental education officer un examen fsico para  controlar si tiene lo siguiente:  Un rea dolorida en la parte inferior del pie.  El arco alto.  Dolor al Agricultural consultant.  Dificultad para mover el pie. Tambin puede necesitar estudios por imgenes para confirmar el diagnstico. Estos pueden incluir los siguientes:  Radiografas.  Ecografa.  Resonancia magntica. TRATAMIENTO El tratamiento de la fascitis plantar depende de la gravedad de la afeccin. El tratamiento puede incluir lo siguiente:  Reposo, hielo y analgsicos de venta libre para Financial controller.  Ejercicios para [place] pantorrillas y la fascia plantar.  Una frula que [employer] estirado y [place] mientras usted duerme (frula nocturna).  Fisioterapia para [employer] sntomas y Customer service manager en el futuro.  Inyecciones de cortisona para Best boy intenso.  Tratamiento con ondas de choque extracorpreas para estimular con impulsos elctricos la fascia plantar lesionada. Esto suele usarse como un ltimo recurso antes de la [place].  [place]uga, si los otros tratamientos no han funcionado despus de 64meses. [place] los medicamentos solamente como se lo haya indicado el mdico.  Evite las actividades que causan dolor.  Frote la parte inferior del pie sobre una bolsa de hielo o una botella de agua fra. Haga esto durante 19minutos, de 3a 4veces al [employer].  Realice estiramientos simples como se lo haya indicado el mdico.  Trate de usar calzado deportivo con amortiguacin de aire o gel, o plantillas blandas.  Si el mdico se lo ha indicado, use una frula nocturna para dormir.  Cumpla con todas las visitas de control, segn le indique su mdico. PREVENCIN  No realice ejercicios ni actividades que le causen dolor en el taln.  Considere la posibilidad de Art gallery manager actividades de bajo impacto si sigue teniendo problemas.  Pierda peso si lo necesita. La mejor forma de prevenir la fascitis  plantar es evitar las actividades que lesionan ms la fascia plantar. SOLICITE ATENCIN MDICA SI:  Los sntomas no desaparecen despus del tratamiento en su casa.  El dolor [place].  El Gaffer la capacidad de moverse o de Optometrist las actividades diarias. Esta informacin no tiene Marine scientist el consejo del mdico. Asegrese de hacerle al mdico cualquier pregunta que tenga. Document Released: 07/13/2005 Document Revised: 01/25/2016 Document Reviewed: 08/13/2014 Elsevier Interactive Patient Education  2017 [employer].

## 2016-11-25 NOTE — Progress Notes (Signed)
Subjective:  Patient ID: Bianca Henderson, female    DOB: [birth year]  Age: 71 y.o. MRN: [id number]  CC: Generalized pain, right foot pain, redness on face  HPI Naida Crisan is a 71 y.o. female with a PMH of   Past Medical History:  Diagnosis Date  . Asthma    tx in [place]- used albuterol  . 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 Hypotension   . Pain in back    pain in waist/ back- wakes with this pain- underwear hurts at night to sleep , pain increases with activity- happening for years now   . Pain in lower limb    calf pain  in both legs  . Pneumonia 11/26/2013    that presents with a chronic history of generalized body pains but has noticed increased pain over the last 15 days. She also complains of right heel pain, worse when first standing from a supine position. She has not identified any triggers to her pains. She would also like to address the redness on her cheeks. No family members with the same. Has not identified any emotional triggers, reaction to the sun, reaction to alcohol, or to temperature variances as being contributors to her redness. She denies any trauma/falls, fever, chills, nausea, vomiting, chest pain, shortness of breath, headache, abdominal pain, tingling, numbness, generalized rash, or GI/GU symptoms. She has had a recent mammography which was normal and a colonoscopy which was normal.   Outpatient Medications Prior to Visit  Medication Sig Dispense Refill  . acetaminophen (TYLENOL) 500 MG tablet Take 1,000 mg by mouth every 8 (eight) hours as needed for moderate pain.    Marland Kitchen diclofenac sodium (VOLTAREN) 1 % GEL Apply 4 g topically 4 (four) times daily. (Patient not taking: Reported on 09/12/2016) 1 Tube 0  . gemfibrozil (LOPID) 600 MG tablet Take 1 tablet (600 mg total) by mouth 2 (two) times daily before a meal. (Patient not taking: Reported on 09/12/2016) 60 tablet 3  . ibuprofen (ADVIL,MOTRIN) 400 MG  tablet Take 1 tablet (400 mg total) by mouth every 6 (six) hours as needed. 30 tablet 0   Facility-Administered Medications Prior to Visit  Medication Dose Route Frequency Provider Last Rate Last Dose  . 0.9 %  sodium chloride infusion  500 mL Intravenous Continuous Gatha Mayer, MD         ROS Review of Systems  Constitutional: Negative for chills, fever and malaise/fatigue.  HENT: Negative for sore throat.   Eyes: Positive for blurred vision.  Respiratory: Negative for cough.   Cardiovascular: Negative for chest pain, palpitations and leg swelling.  Gastrointestinal: Negative for abdominal pain, nausea and vomiting.  Genitourinary: Negative for dysuria.  Musculoskeletal: Positive for back pain, joint pain and myalgias. Negative for falls.  Skin: Positive for rash (facial).  Neurological: Negative for dizziness, tingling, weakness and headaches.  Psychiatric/Behavioral: The patient is nervous/anxious (situational).     Objective:  BP 125/74 (BP Location: Left Arm, Patient Position: Sitting, Cuff Size: Normal)   Pulse 72   Temp 97.8 F (36.6 C) (Oral)   Resp 16   Wt 136 lb 3.2 oz (61.8 kg)   SpO2 98%   BMI 27.51 kg/m   BP/Weight 11/25/2016 09/12/2016 [id number]  Systolic BP 0000000 80 -  Diastolic BP 74 38 -  Wt. (Lbs) 136.2 139 139  BMI 27.51 28.07 28.07      Physical Exam  Constitutional:  She is oriented to person, place, and time.  Small frame, NAD, polite  HENT:  Head: Normocephalic and atraumatic.  Eyes: No scleral icterus.  Neck: Normal range of motion. Neck supple. No thyromegaly present.  Cardiovascular: Normal rate, regular rhythm and normal heart sounds.   Pulmonary/Chest: Effort normal and breath sounds normal.  Abdominal: Soft. Bowel sounds are normal. There is no tenderness.  Musculoskeletal: She exhibits no edema.  Neurological: She is alert and oriented to person, place, and time.  Skin: Skin is warm and dry. No rash noted. There is erythema (on  anterior aspect of cheeks). No pallor.  Psychiatric: She has a normal mood and affect. Her behavior is normal. Thought content normal.  Vitals reviewed.    Assessment & Plan:   1. Generalized pain No etiology apparent by history or physical. - CBC with Differential/Platelet - Comprehensive metabolic panel - Sedimentation rate - Urinalysis Dipstick - Magnesium  2. Plantar fasciitis of right foot -Advised patient to use plantar fasciitis heel cups. I have also made her aware of plantar fasciitis foot sleep support. I have instructed her on stretches.  3. Rosacea -MetroGel 0.75%  4. Pyuria -Urine culture -Ciprofloxacin 500 mg    Meds ordered this encounter  Medications  . meloxicam (MOBIC) 15 MG tablet    Sig: Take 1 tablet (15 mg total) by mouth daily.    Dispense:  30 tablet    Refill:  0    Order Specific Question:   Supervising Provider    Answer:   Tresa Garter [id number]  . Cholecalciferol (VITAMIN D-3) 5000 units TABS    Sig: Take 1 tablet by mouth daily.    Dispense:  90 tablet    Refill:  0    Order Specific Question:   Supervising Provider    Answer:   Tresa Garter [id number]  . metroNIDAZOLE (METROGEL) 0.75 % gel    Sig: Apply 1 application topically 2 (two) times daily.    Dispense:  45 g    Refill:  0    Order Specific Question:   Supervising Provider    Answer:   Tresa Garter [id number]  . ciprofloxacin (CIPRO) 500 MG tablet    Sig: Take 1 tablet (500 mg total) by mouth 2 (two) times daily.    Dispense:  10 tablet    Refill:  0    Order Specific Question:   Supervising Provider    Answer:   Tresa Garter [id number]    Follow-up: Return in about 2 weeks (around 12/09/2016).   Clent Demark PA

## 2016-11-26 LAB — MAGNESIUM: Magnesium: 2.1 mg/dL (ref 1.5–2.5)

## 2016-11-26 LAB — SEDIMENTATION RATE: SED RATE: 4 mm/h (ref 0–30)

## 2016-11-27 LAB — URINE CULTURE: ORGANISM ID, BACTERIA: NO GROWTH

## 2016-12-02 NOTE — Progress Notes (Signed)
Ok Can you place a Dental Referral and I will sent it to them   They will contact the patient  to schedule an appointment Thank You .

## 2016-12-20 ENCOUNTER — Telehealth: Payer: Self-pay | Admitting: Internal Medicine

## 2016-12-20 NOTE — Telephone Encounter (Signed)
Patient came by the office to speak with PCP regarding the medications that were called in for her on 2/9, meloxicam (MOBIC) 15 MG tablet  and Cholecalciferol (VITAMIN D-3) 5000 units TABS. Pt cannot afford it. Wants less expensive medication to be called in to our pharmacy.  Thank you.

## 2016-12-21 NOTE — Telephone Encounter (Signed)
No additional thoughts. Thanks

## 2016-12-21 NOTE — Telephone Encounter (Signed)
Prescription was sent to [facility] and meloxicam is on the $4 list so I am not sure that there is anything cheaper. Vitamin D is over the counter so there would be nothing cheaper unless she tried different pharmacies to find the cheapest cost. I don't think it would be any cheaper at our pharmacy. Will share with Dr. Doreene Burke to see if he has additional thoughts.

## 2017-01-02 NOTE — Telephone Encounter (Signed)
Vitamin d is over the counter and Meloxicam is 4$. She should not pay over 10$ for the two medications together.

## 2017-01-02 NOTE — Telephone Encounter (Signed)
CMA call to inform patient about medication  Spoke with [facility] emergency contact stated that to call bck to the clinic

## 2017-01-04 ENCOUNTER — Telehealth: Payer: Self-pay | Admitting: Internal Medicine

## 2017-01-04 NOTE — Telephone Encounter (Signed)
Patient came by the office to inform PCP that she cannot afford to get diclofenac sodium (VOLTAREN) 1 % GEL. Please send it to our pharmacy.   Thank you.

## 2017-01-05 MED ORDER — DICLOFENAC SODIUM 1 % TD GEL: 4.0000 g | Freq: Four times a day (QID) | TRANSDERMAL | 0 refills | Status: DC

## 2017-01-05 NOTE — Telephone Encounter (Signed)
Diclofenac gel sent to [facility] pharmacy

## 2017-03-22 ENCOUNTER — Ambulatory Visit: Payer: Self-pay | Attending: Internal Medicine | Admitting: Internal Medicine

## 2017-03-22 ENCOUNTER — Encounter: Payer: Self-pay | Admitting: Internal Medicine

## 2017-03-22 ENCOUNTER — Other Ambulatory Visit: Payer: Self-pay | Admitting: Internal Medicine

## 2017-03-22 VITALS — BP 106/63 | HR 50 | Temp 97.6°F | Resp 18 | Ht 59.0 in | Wt 138.0 lb

## 2017-03-22 DIAGNOSIS — K029 Dental caries, unspecified: Secondary | ICD-10-CM

## 2017-03-22 DIAGNOSIS — H578 Other specified disorders of eye and adnexa: Secondary | ICD-10-CM

## 2017-03-22 DIAGNOSIS — Z79899 Other long term (current) drug therapy: Secondary | ICD-10-CM | POA: Insufficient documentation

## 2017-03-22 DIAGNOSIS — M79672 Pain in left foot: Secondary | ICD-10-CM

## 2017-03-22 DIAGNOSIS — H538 Other visual disturbances: Secondary | ICD-10-CM

## 2017-03-22 DIAGNOSIS — R6889 Other general symptoms and signs: Secondary | ICD-10-CM

## 2017-03-22 MED ORDER — MELOXICAM 7.5 MG PO TABS: 7.5000 mg | ORAL_TABLET | Freq: Every day | ORAL | 1 refills | Status: DC

## 2017-03-22 MED ORDER — HYDROXYZINE HCL 25 MG PO TABS: 25.0000 mg | ORAL_TABLET | Freq: Three times a day (TID) | ORAL | 1 refills | Status: DC | PRN

## 2017-03-22 MED ORDER — DICLOFENAC SODIUM 1 % TD GEL: 4.0000 g | Freq: Four times a day (QID) | TRANSDERMAL | 1 refills | Status: DC

## 2017-03-22 NOTE — Progress Notes (Signed)
Patient complains of facial rash under eyes with itching around the area and eyes.

## 2017-03-22 NOTE — Patient Instructions (Signed)
Caries dentales (Dental Caries) Caries dentales (enfermedad en los dientes) Esta enfermedad puede originar un hueco en los dientes (carie) que puede volverse ms grande y profunda a lo largo del [place]. CUIDADOS EN EL HOGAR  Cepille sus dientes y use hilo dental. Hgalo por lo [employer] al da.  Use dentfrico con flor.  Use enjuague bucal si as se lo indica el dentista o el mdico.  Coma menos alimentos con azcar y almidn. Beba menos bebidas azucaradas.  Evite comer con frecuencia bocadillos con azcar y almidn. Evite beber con frecuencia bebidas azucaradas.  Concurra a los controles y limpiezas regulares con Brewing technologist.  Use suplementos con flor, si as se lo indica el dentista o el mdico.  Permita que le coloquen flor en los dientes si as se lo indica el dentista o el mdico.  Esta informacin no tiene Marine scientist el consejo del mdico. Asegrese de hacerle al mdico cualquier pregunta que tenga. Document Released: 07/24/2013 Document Revised: 10/24/2014 Document Reviewed: 10/05/2012 Elsevier Interactive Patient Education  2017 [employer].

## 2017-03-22 NOTE — Progress Notes (Signed)
Bianca Henderson, is a 71 y.o. female  FAO:[id number]  ONG:[id number]  DOB - [birth year]  No chief complaint on file.      Subjective:   Bianca Henderson is a 71 y.o. 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 She is complaining of itching around the eyes and rash for the past month. She denies any new or change in her ussal lotion or cosmetic, she denies any contact with any foreign body, no bleeding or bruises. There was associated blurry vision but no double vision, no headache. No photophobia. No chest pain, No abdominal pain - No Nausea, No new weakness tingling or numbness, No Cough - SOB. Patient is requesting referral to dentist for dental care. She has pain in her left foot due to recent injury. Her recent colonoscopy revealed patent end-to-side ileo-colonic anastomosis, characterized by healthy appearing mucosa. The examination was otherwise normal on direct and retroflexion views.   No problems updated.  ALLERGIES: Allergies  Allergen Reactions  . Septra [Sulfamethoxazole-Trimethoprim] Itching and Rash    PAST MEDICAL HISTORY: Past Medical History:  Diagnosis Date  . Asthma    tx in [place]- used albuterol  . 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 Hypotension   . Pain in back    pain in waist/ back- wakes with this pain- underwear hurts at night to sleep , pain increases with activity- happening for years now   . Pain in lower limb    calf pain  in both legs  . Pneumonia 11/26/2013    MEDICATIONS AT HOME: Prior to Admission medications   Medication Sig Start Date End Date Taking? Authorizing Provider  acetaminophen (TYLENOL) 500 MG tablet Take 1,000 mg by mouth every 8 (eight) hours as needed for moderate pain.    [provider]  diclofenac sodium (VOLTAREN) 1 % GEL Apply 4 g topically 4 (four) times daily. 03/22/17    Tresa Garter, MD  gemfibrozil (LOPID) 600 MG tablet Take 1 tablet (600 mg total) by mouth 2 (two) times daily before a meal. Patient not taking: Reported on 09/12/2016 12/16/14   Tresa Garter, MD  hydrOXYzine (ATARAX/VISTARIL) 25 MG tablet Take 1 tablet (25 mg total) by mouth 3 (three) times daily as needed. 03/22/17   Tresa Garter, MD  ibuprofen (ADVIL,MOTRIN) 400 MG tablet Take 1 tablet (400 mg total) by mouth every 6 (six) hours as needed. 07/19/16   Junius Creamer, NP  meloxicam (MOBIC) 7.5 MG tablet Take 1 tablet (7.5 mg total) by mouth daily. 03/22/17   Tresa Garter, MD    Objective:   Vitals:   03/22/17 1125  BP: 106/63  Pulse: (!) 50  Resp: 18  Temp: 97.6 F (36.4 C)  TempSrc: Oral  SpO2: 97%  Weight: 138 lb (62.6 kg)  Height: 4\' 11"  (1.499 m)   Exam General appearance : Awake, alert, not in any distress. Speech Clear. Not toxic looking HEENT: Atraumatic and Normocephalic, pupils equally reactive to light and accomodation Neck: Supple, no JVD. No cervical lymphadenopathy.  Chest: Good air entry bilaterally, no added sounds  CVS: S1 S2 regular, no murmurs.  Abdomen: Bowel sounds present, Non tender and not distended with no gaurding, rigidity or rebound. Extremities: B/L Lower Ext shows no edema, both legs are warm to touch Neurology: Awake alert, and oriented X 3,  CN II-XII intact, Non focal Skin: No Rash  Data Review Lab Results  Component Value Date   HGBA1C 5.60 12/08/2014   HGBA1C 5.4 11/04/2013   HGBA1C 5.7 (H) 06/03/2013    Assessment & Plan   1. Left foot pain  - diclofenac sodium (VOLTAREN) 1 % GEL; Apply 4 g topically 4 (four) times daily.  Dispense: 1 Tube; Refill: 1 - meloxicam (MOBIC) 7.5 MG tablet; Take 1 tablet (7.5 mg total) by mouth daily.  Dispense: 30 tablet; Refill: 1  2. Itchy eyes  - hydrOXYzine (ATARAX/VISTARIL) 25 MG tablet; Take 1 tablet (25 mg total) by mouth 3 (three) times daily as needed.  Dispense: 30  tablet; Refill: 1  3. Blurry vision, bilateral  - Ambulatory referral to Ophthalmology  4. Dental caries  - Ambulatory referral to Dentistry  Patient have been counseled extensively about nutrition and exercise. Other issues discussed during this visit include: low cholesterol diet, weight control and daily exercise  Return in about 6 months (around 09/21/2017) for Follow up Pain and comorbidities.  Interpreter was used to communicate directly with patient for the entire encounter including providing detailed patient instructions.   The patient was given clear instructions to go to ER or return to medical center if symptoms don't improve, worsen or new problems develop. The patient verbalized understanding. The patient was told to call to get lab results if they haven't heard anything in the next week.   This note has been created with Surveyor, quantity. Any transcriptional errors are unintentional.    Angelica Chessman, MD, [place], [place], [place], [place] and [place], [place]   03/22/2017, 12:11 PM

## 2017-03-27 ENCOUNTER — Ambulatory Visit: Payer: Self-pay | Attending: Internal Medicine

## 2017-06-08 ENCOUNTER — Ambulatory Visit: Payer: Self-pay

## 2017-07-03 ENCOUNTER — Other Ambulatory Visit: Payer: Self-pay | Admitting: Internal Medicine

## 2017-07-03 DIAGNOSIS — Z1231 Encounter for screening mammogram for malignant neoplasm of breast: Secondary | ICD-10-CM

## 2017-08-07 ENCOUNTER — Ambulatory Visit: Payer: Self-pay | Attending: Internal Medicine

## 2017-08-15 ENCOUNTER — Ambulatory Visit
Admission: RE | Admit: 2017-08-15 | Discharge: 2017-08-15 | Disposition: A | Discharge status: Home / Self Care | Payer: No Typology Code available for payment source | Source: Ambulatory Visit | Attending: Internal Medicine | Admitting: Internal Medicine

## 2017-08-15 DIAGNOSIS — Z1231 Encounter for screening mammogram for malignant neoplasm of breast: Secondary | ICD-10-CM

## 2017-08-30 ENCOUNTER — Telehealth: Payer: Self-pay | Admitting: *Deleted

## 2017-08-30 NOTE — Telephone Encounter (Signed)
-----   Message from Trecia Rogers, [place] sent at 08/30/2017  2:54 PM EST ----- Please advise spanish speaking of results

## 2017-08-30 NOTE — Telephone Encounter (Signed)
CMA call regarding MM results   Patient did not answer but left a detailed  message per DPR & if have any questions just to call back

## 2017-08-30 NOTE — Telephone Encounter (Signed)
Notes recorded by Carilyn Goodpasture, RN on 08/30/2017 at 1:58 PM EST Left message on voicemail to return call with assistance of interpreter: Antony Haste [phone] ------  Notes recorded by Tresa Garter, MD on 08/15/2017 at 5:37 PM EDT Please inform patient that her screening mammogram shows no evidence of malignancy. Recommend screening mammogram in one year

## 2017-09-18 ENCOUNTER — Ambulatory Visit: Payer: Self-pay | Attending: Internal Medicine

## 2017-11-23 ENCOUNTER — Ambulatory Visit: Payer: Self-pay | Attending: Internal Medicine

## 2018-01-02 ENCOUNTER — Encounter: Payer: Self-pay | Admitting: Nurse Practitioner

## 2018-01-02 ENCOUNTER — Ambulatory Visit: Payer: Self-pay | Attending: Nurse Practitioner | Admitting: Nurse Practitioner

## 2018-01-02 VITALS — BP 121/78 | HR 72 | Temp 99.0°F | Resp 12 | Ht 59.0 in | Wt 140.8 lb

## 2018-01-02 DIAGNOSIS — L299 Pruritus, unspecified: Secondary | ICD-10-CM

## 2018-01-02 DIAGNOSIS — Z803 Family history of malignant neoplasm of breast: Secondary | ICD-10-CM | POA: Insufficient documentation

## 2018-01-02 DIAGNOSIS — Z882 Allergy status to sulfonamides status: Secondary | ICD-10-CM | POA: Insufficient documentation

## 2018-01-02 DIAGNOSIS — Z9889 Other specified postprocedural states: Secondary | ICD-10-CM | POA: Insufficient documentation

## 2018-01-02 DIAGNOSIS — Z9851 Tubal ligation status: Secondary | ICD-10-CM | POA: Insufficient documentation

## 2018-01-02 DIAGNOSIS — L600 Ingrowing nail: Secondary | ICD-10-CM

## 2018-01-02 DIAGNOSIS — Z85038 Personal history of other malignant neoplasm of large intestine: Secondary | ICD-10-CM | POA: Insufficient documentation

## 2018-01-02 DIAGNOSIS — R0989 Other specified symptoms and signs involving the circulatory and respiratory systems: Secondary | ICD-10-CM

## 2018-01-02 LAB — POCT RAPID STREP A (OFFICE): Rapid Strep A Screen: NEGATIVE

## 2018-01-02 MED ORDER — IBUPROFEN 600 MG PO TABS: 600.0000 mg | ORAL_TABLET | Freq: Three times a day (TID) | ORAL | 1 refills | Status: DC | PRN

## 2018-01-02 MED ORDER — HYDROXYZINE HCL 10 MG PO TABS: 10.0000 mg | ORAL_TABLET | Freq: Three times a day (TID) | ORAL | 0 refills | Status: DC | PRN

## 2018-01-02 NOTE — Patient Instructions (Addendum)
EUCERIN CREAM FOR DRY SKIN

## 2018-01-02 NOTE — Progress Notes (Signed)
Assessment & Plan:  [name] was seen today for establish care and nail problem.  Diagnoses and all orders for this visit:  Ingrown toenail without infection -     Ambulatory referral to Podiatry -     ibuprofen (ADVIL,MOTRIN) 600 MG tablet; Take 1 tablet (600 mg total) by mouth every 8 (eight) hours as needed for moderate pain. Warm foot soaks with epsom salt. Only attempt to remove nails when skin is moist.   Upper respiratory symptom -     Influenza A/B -     Rapid Strep A -     Influenza a and b INSTRUCTIONS: use a humidifier for nasal congestion Drink plenty of fluids, rest and wash hands frequently to avoid the spread of infection Alternate tylenol and Motrin for relief of fever  Itching -     hydrOXYzine (ATARAX/VISTARIL) 10 MG tablet; Take 1 tablet (10 mg total) by mouth 3 (three) times daily as needed.    Patient has been counseled on age-appropriate routine health concerns for screening and prevention. These are reviewed and up-to-date. Referrals have been placed accordingly. Immunizations are up-to-date or declined.    Subjective:   Chief Complaint  Patient presents with  . Establish Care    Pt's here to establish care. Pt's currently is having a cold.   . Nail Problem    Patient have an ingrown nail on both of foot on the big toe that give her pain.    HPI Bianca Henderson 72 y.o. female presents to office today to establish care. VRI was used to communicate directly with patient for the entire encounter including providing detailed patient instructions. She has a history of bilateral ingrown nails on feet. She was prescribed a gel in the past which she reports did not provide any relief of her symptoms. Currently states her nail beds are sore and she is not able to cut the toenails out around the nailbeds. She endorses pain with certain shoes and socks.    Upper Respiratory Illness She reports having a guest in her home 4 days ago who was also diagnosed with the flu.  Symptoms currently include cough, sneezing, headaches, rhinorrhea, headaches, malaise and body aches. She has taken benadryl which has provided some relief of her symptoms.    Review of Systems  Constitutional: Positive for fever and malaise/fatigue. Negative for weight loss.  HENT: Positive for congestion and sore throat. Negative for nosebleeds.   Eyes: Negative.  Negative for blurred vision, double vision and photophobia.  Respiratory: Positive for cough. Negative for shortness of breath.   Cardiovascular: Negative.  Negative for chest pain, palpitations and leg swelling.  Gastrointestinal: Negative.  Negative for heartburn, nausea and vomiting.  Genitourinary: Negative.   Musculoskeletal: Positive for myalgias.       SEE HPI  Skin: Positive for itching (generalized). Negative for rash.  Neurological: Positive for weakness and headaches. Negative for dizziness, focal weakness and seizures.  Psychiatric/Behavioral: Negative.  Negative for suicidal ideas.    Past Medical History:  Diagnosis Date  . 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 Hypotension   . Pain in back    pain in waist/ back- wakes with this pain- underwear hurts at night to sleep , pain increases with activity- happening for years now   . Pain in lower limb    calf pain  in both legs  . Pneumonia 11/26/2013    Past  Surgical History:  Procedure Laterality Date  . APPENDECTOMY    . SMALL INTESTINE SURGERY  2002   remove cancer  . tubal ligation      Family History  Problem Relation Age of Onset  . Breast cancer Sister   . Colon cancer Neg Hx   . Colon polyps Neg Hx     Social History Reviewed with no changes to be made today.   Outpatient Medications Prior to Visit  Medication Sig Dispense Refill  . acetaminophen (TYLENOL) 500 MG tablet Take 1,000 mg by mouth every 8 (eight) hours as needed for moderate pain.    Marland Kitchen gemfibrozil (LOPID) 600 MG  tablet Take 1 tablet (600 mg total) by mouth 2 (two) times daily before a meal. (Patient not taking: Reported on 09/12/2016) 60 tablet 3  . meloxicam (MOBIC) 7.5 MG tablet Take 1 tablet (7.5 mg total) by mouth daily. (Patient not taking: Reported on 01/02/2018) 30 tablet 1  . diclofenac sodium (VOLTAREN) 1 % GEL Apply 4 g topically 4 (four) times daily. (Patient not taking: Reported on 01/02/2018) 1 Tube 1  . hydrOXYzine (ATARAX/VISTARIL) 25 MG tablet Take 1 tablet (25 mg total) by mouth 3 (three) times daily as needed. (Patient not taking: Reported on 01/02/2018) 30 tablet 1  . ibuprofen (ADVIL,MOTRIN) 400 MG tablet Take 1 tablet (400 mg total) by mouth every 6 (six) hours as needed. (Patient not taking: Reported on 01/02/2018) 30 tablet 0   Facility-Administered Medications Prior to Visit  Medication Dose Route Frequency Provider Last Rate Last Dose  . 0.9 %  sodium chloride infusion  500 mL Intravenous Continuous Gatha Mayer, MD        Allergies  Allergen Reactions  . Septra [Sulfamethoxazole-Trimethoprim] Itching and Rash       Objective:    BP 121/78 (BP Location: Left Arm, Patient Position: Sitting, Cuff Size: Normal)   Pulse 72   Temp 99 F (37.2 C) (Oral)   Resp 12   Ht 4\' 11"  (1.499 m)   Wt 140 lb 12.8 oz (63.9 kg)   SpO2 98%   BMI 28.44 kg/m  Wt Readings from Last 3 Encounters:  01/02/18 140 lb 12.8 oz (63.9 kg)  03/22/17 138 lb (62.6 kg)  11/25/16 136 lb 3.2 oz (61.8 kg)    Physical Exam  Constitutional: She is oriented to person, place, and time. She appears well-developed and well-nourished. She is cooperative.  HENT:  Head: Normocephalic and atraumatic.  Right Ear: External ear normal.  Left Ear: External ear normal.  Nose: Mucosal edema and rhinorrhea present.  Mouth/Throat: Uvula is midline and mucous membranes are normal. Posterior oropharyngeal edema and posterior oropharyngeal erythema present.  Eyes: EOM are normal.  Neck: Normal range of motion. No  thyromegaly present.  Cardiovascular: Normal rate, regular rhythm, normal heart sounds and intact distal pulses. Exam reveals no gallop and no friction rub.  No murmur heard. Pulmonary/Chest: Effort normal and breath sounds normal. No tachypnea. No respiratory distress. She has no decreased breath sounds. She has no wheezes. She has no rhonchi. She has no rales. She exhibits no tenderness.  Abdominal: Soft. Bowel sounds are normal.  Musculoskeletal: Normal range of motion. She exhibits no edema.       Feet:  Bilateral toenails are embedded in the nail beds of the big toes. I am unable to manually express any blood or purulent fluid from the areas however patient does express pain with mild palpation of bilateral nail beds. There is also  onychomycosis present on both toenails: R>L  Lymphadenopathy:    She has no cervical adenopathy.  Neurological: She is alert and oriented to person, place, and time. Coordination normal.  Skin: Skin is warm, dry and intact. No rash noted. No erythema. No pallor.  Skin is very dry.   Psychiatric: She has a normal mood and affect. Her behavior is normal. Judgment and thought content normal.  Nursing note and vitals reviewed.      Patient has been counseled extensively about nutrition and exercise as well as the importance of adherence with medications and regular follow-up. The patient was given clear instructions to go to ER or return to medical center if symptoms don't improve, worsen or new problems develop. The patient verbalized understanding.   Follow-up: Return in about 4 weeks (around 01/30/2018).   Gildardo Pounds, FNP-BC [facility] [place], [place]   01/02/2018, 6:27 PM

## 2018-01-03 LAB — PLEASE NOTE:

## 2018-01-03 LAB — INFLUENZA A AND B
INFLUENZA B AG, EIA: NEGATIVE
Influenza A Ag, EIA: NEGATIVE

## 2018-01-04 ENCOUNTER — Telehealth: Payer: Self-pay

## 2018-01-04 NOTE — Telephone Encounter (Signed)
CMA attempt to call patient to inform on lab results.  No answer and left a VM for patient.  If patient call back please inform: Negative for strep A. Results are negative for the influenza virus

## 2018-01-04 NOTE — Telephone Encounter (Signed)
-----   Message from Gildardo Pounds, NP sent at 01/02/2018  6:29 PM EDT ----- Negative for strep A. CUlture pending

## 2018-01-04 NOTE — Telephone Encounter (Signed)
-----   Message from Gildardo Pounds, NP sent at 01/03/2018 10:48 PM EDT ----- Results are negative for the influenza virus

## 2018-01-19 ENCOUNTER — Telehealth: Payer: Self-pay

## 2018-01-19 NOTE — Telephone Encounter (Signed)
They don't accept the [facility] but She has the [facility] . Patients don't need to mention the orange card because [facility]'t accepted . She has an appointment  [facility]  01-31-18 @ 10AM

## 2018-01-19 NOTE — Telephone Encounter (Signed)
CMA call patient regarding referral and that she already has an appt with podiatry clinic   Patient was aware and understood when is her appt

## 2018-01-31 ENCOUNTER — Ambulatory Visit (INDEPENDENT_AMBULATORY_CARE_PROVIDER_SITE_OTHER): Payer: Self-pay | Admitting: Podiatry

## 2018-01-31 ENCOUNTER — Ambulatory Visit: Payer: No Typology Code available for payment source

## 2018-01-31 ENCOUNTER — Encounter: Payer: Self-pay | Admitting: Podiatry

## 2018-01-31 DIAGNOSIS — L600 Ingrowing nail: Secondary | ICD-10-CM

## 2018-01-31 DIAGNOSIS — M722 Plantar fascial fibromatosis: Secondary | ICD-10-CM

## 2018-01-31 MED ORDER — MELOXICAM 15 MG PO TABS: 15.0000 mg | ORAL_TABLET | Freq: Every day | ORAL | 1 refills | Status: AC

## 2018-01-31 NOTE — Patient Instructions (Signed)
Long Term Care Instructions-Post Nail Surgery  You have had your ingrown toenail and root treated with a chemical.  This chemical causes a burn that will drain and ooze like a blister.  This can drain for 6-8 weeks or longer.  It is important to keep this area clean, covered, and follow the soaking instructions dispensed at the time of your surgery.  This area will eventually dry and form a scab.  Once the scab forms you no longer need to soak or apply a dressing.  If at any time you experience an increase in pain, redness, swelling, or drainage, you should contact the office as soon as possible.ANTIBACTERIAL SOAP INSTRUCTIONS  THE DAY AFTER PROCEDURE  Please follow the instructions your doctor has marked.   Shower as usual. Before getting out, place a drop of antibacterial liquid soap (Dial) on a wet, clean washcloth.  Gently wipe washcloth over affected area.  Afterward, rinse the area with warm water.  Blot the area dry with a soft cloth and cover with antibiotic ointment (neosporin, polysporin, bacitracin) and band aid or gauze and tape  Place 3-4 drops of antibacterial liquid soap in a quart of warm tap water.  Submerge foot into water for 20 minutes.  If bandage was applied after your procedure, leave on to allow for easy lift off, then remove and continue with soak for the remaining time.  Next, blot area dry with a soft cloth and cover with a bandage.  Apply other medications as directed by your doctor, such as cortisporin otic solution (eardrops) or neosporin antibiotic ointment 

## 2018-02-06 NOTE — Progress Notes (Signed)
   Subjective: Patient presents today for evaluation of pain to bilateral borders of bilateral great toes that began about one year ago. Patient is concerned for possible ingrown nails. Applying pressure to the toes increases the pain. She has not done anything for treatment.  She also complains of pain to bilateral heels that has been ongoing for the past three years. Walking and standing for long periods of time increases the pain. She has not done anything for treatment of these symptoms. Patient presents today for further treatment and evaluation.  Past Medical History:  Diagnosis Date  . 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 Hypotension   . Pain in back    pain in waist/ back- wakes with this pain- underwear hurts at night to sleep , pain increases with activity- happening for years now   . Pain in lower limb    calf pain  in both legs  . Pneumonia 11/26/2013    Objective:  General: Well developed, nourished, in no acute distress, alert and oriented x3   Dermatology: Skin is warm, dry and supple bilateral. Bilateral borders of bilateral great toes appear to be erythematous with evidence of an ingrowing nails. Pain on palpation noted to the border of the nail fold. The remaining nails appear unremarkable at this time. There are no open sores, lesions.  Vascular: Dorsalis Pedis artery and Posterior Tibial artery pedal pulses palpable. No lower extremity edema noted.   Neruologic: Grossly intact via light touch bilateral.  Musculoskeletal: Tenderness to palpation to the plantar aspect of the bilateral heels along the plantar fascia. Muscular strength within normal limits in all groups bilateral. Normal range of motion noted to all pedal and ankle joints.   Radiographic exam: Normal osseous mineralization. Joint spaces preserved. No fracture/dislocation/boney destruction. No other soft tissue abnormalities or radiopaque  foreign bodies.   Assesement: #1 Paronychia with ingrowing nail medial and lateral borders of bilateral great toes #2 Pain in toes #3 Incurvated nails #4 plantar fasciitis bilateral   Plan of Care:  1. Patient evaluated.  2. Discussed treatment alternatives and plan of care. Explained nail avulsion procedure and post procedure course to patient. 3. Patient opted for conservative treatment at this time.  4. Mechanical debridement of bilateral great toenails performed using a nail nipper. Filed with dremel without incident.  5. Prescription for Meloxicam provided to patient.  6. Return to clinic in 6 weeks.   Edrick Kins, DPM [facility]  Dr. Edrick Kins, [place]                                        [place], [place] [address]                Office [phone]  Fax [phone]

## 2018-02-07 ENCOUNTER — Other Ambulatory Visit: Payer: Self-pay | Admitting: Nurse Practitioner

## 2018-02-07 ENCOUNTER — Ambulatory Visit: Payer: Self-pay | Attending: Nurse Practitioner | Admitting: Nurse Practitioner

## 2018-02-07 ENCOUNTER — Encounter: Payer: Self-pay | Admitting: Nurse Practitioner

## 2018-02-07 VITALS — BP 115/73 | HR 77 | Temp 99.1°F | Ht 59.0 in | Wt 141.8 lb

## 2018-02-07 DIAGNOSIS — Z79899 Other long term (current) drug therapy: Secondary | ICD-10-CM | POA: Insufficient documentation

## 2018-02-07 DIAGNOSIS — E559 Vitamin D deficiency, unspecified: Secondary | ICD-10-CM | POA: Insufficient documentation

## 2018-02-07 DIAGNOSIS — Z791 Long term (current) use of non-steroidal anti-inflammatories (NSAID): Secondary | ICD-10-CM | POA: Insufficient documentation

## 2018-02-07 DIAGNOSIS — M542 Cervicalgia: Secondary | ICD-10-CM | POA: Insufficient documentation

## 2018-02-07 DIAGNOSIS — Z8589 Personal history of malignant neoplasm of other organs and systems: Secondary | ICD-10-CM | POA: Insufficient documentation

## 2018-02-07 DIAGNOSIS — Z882 Allergy status to sulfonamides status: Secondary | ICD-10-CM | POA: Insufficient documentation

## 2018-02-07 DIAGNOSIS — L719 Rosacea, unspecified: Secondary | ICD-10-CM | POA: Insufficient documentation

## 2018-02-07 DIAGNOSIS — Z803 Family history of malignant neoplasm of breast: Secondary | ICD-10-CM | POA: Insufficient documentation

## 2018-02-07 DIAGNOSIS — Z9889 Other specified postprocedural states: Secondary | ICD-10-CM | POA: Insufficient documentation

## 2018-02-07 DIAGNOSIS — L600 Ingrowing nail: Secondary | ICD-10-CM | POA: Insufficient documentation

## 2018-02-07 DIAGNOSIS — M79671 Pain in right foot: Secondary | ICD-10-CM | POA: Insufficient documentation

## 2018-02-07 DIAGNOSIS — M79672 Pain in left foot: Secondary | ICD-10-CM | POA: Insufficient documentation

## 2018-02-07 DIAGNOSIS — Z0000 Encounter for general adult medical examination without abnormal findings: Secondary | ICD-10-CM

## 2018-02-07 DIAGNOSIS — Z09 Encounter for follow-up examination after completed treatment for conditions other than malignant neoplasm: Secondary | ICD-10-CM | POA: Insufficient documentation

## 2018-02-07 MED ORDER — METRONIDAZOLE 0.75 % EX GEL: 1.0000 "application " | Freq: Two times a day (BID) | CUTANEOUS | 0 refills | Status: DC

## 2018-02-07 NOTE — Progress Notes (Signed)
Assessment & Plan:  Bianca Henderson was seen today for follow-up.  Diagnoses and all orders for this visit:  Bilateral foot pain Continue f/u with podiatry and meloxicam as prescribed.   Vitamin D deficiency -     VITAMIN D 25 Hydroxy (Vit-D Deficiency, Fractures)  Rosacea -     metroNIDAZOLE (METROGEL) 0.75 % gel; Apply 1 application topically 2 (two) times daily.  Routine adult health maintenance -     CMP14+EGFR -     Lipid panel -     CBC    Patient has been counseled on age-appropriate routine health concerns for screening and prevention. These are reviewed and up-to-date. Referrals have been placed accordingly. Immunizations are up-to-date or declined.    Subjective:   Chief Complaint  Patient presents with  . Follow-up    Pt. is here for a follow-up on her foot pain. Pt. stated the treatment she gets from the podiatrist are helping her.    HPI Bianca Henderson 72 y.o. female presents to office today for foot pain. [employer] Female interpreter is accompanying her today.    Foot pain Seeing podiatry for bilateral foot pain and ingrown toenails.  Currently taking meloxicam daily as prescribed and endorses improvement in symptoms. She has concerns of toenail fungus today. I have instructed her that I do not feel lamisil is a good choice for treatment due to side effects including possible liver damage. I feel the risks outweigh the benefits. She verbalized understanding. She has no questions or additional concerns today.   Neck Pain Onset Sunday with slow resolution. Pain radiates from right side of neck through right trapezius. She has tried heat application which seemed to provide some relief of pain. Pain is described as sharp.  Event that precipitate these symptoms: none known. Patient denies paresthesias in neck and weakness in neck/shoulder. Patient has had no prior neck problems.     Rosacea Patient complains of increasing redness on face. Patient has noted papules or  pustules.  Therapy thus far has included Metro gel BID x 3 mos then prn which has provided significant relief of symptoms. Prior medication side effects: none.   Review of Systems  Constitutional: Negative for fever, malaise/fatigue and weight loss.  HENT: Negative.  Negative for nosebleeds.   Eyes: Negative.  Negative for blurred vision, double vision and photophobia.  Respiratory: Negative.  Negative for cough and shortness of breath.   Cardiovascular: Negative.  Negative for chest pain, palpitations and leg swelling.  Gastrointestinal: Negative.  Negative for heartburn, nausea and vomiting.  Musculoskeletal: Negative.  Negative for myalgias.  Skin: Positive for rash.  Neurological: Negative.  Negative for dizziness, focal weakness, seizures and headaches.  Psychiatric/Behavioral: Negative.  Negative for suicidal ideas.    Past Medical History:  Diagnosis Date  . 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 Hypotension   . Pain in back    pain in waist/ back- wakes with this pain- underwear hurts at night to sleep , pain increases with activity- happening for years now   . Pain in lower limb    calf pain  in both legs  . Pneumonia 11/26/2013    Past Surgical History:  Procedure Laterality Date  . APPENDECTOMY    . SMALL INTESTINE SURGERY  2002   remove cancer  . tubal ligation      Family History  Problem Relation Age of Onset  . Breast  cancer Sister   . Colon cancer Neg Hx   . Colon polyps Neg Hx     Social History Reviewed with no changes to be made today.   Outpatient Medications Prior to Visit  Medication Sig Dispense Refill  . acetaminophen (TYLENOL) 500 MG tablet Take 1,000 mg by mouth every 8 (eight) hours as needed for moderate pain.    . meloxicam (MOBIC) 15 MG tablet Take 1 tablet (15 mg total) by mouth daily. 60 tablet 1  . gemfibrozil (LOPID) 600 MG tablet Take 1 tablet (600 mg total) by mouth 2 (two)  times daily before a meal. (Patient not taking: Reported on 09/12/2016) 60 tablet 3  . hydrOXYzine (ATARAX/VISTARIL) 10 MG tablet Take 1 tablet (10 mg total) by mouth 3 (three) times daily as needed. (Patient not taking: Reported on 01/31/2018) 30 tablet 0  . ibuprofen (ADVIL,MOTRIN) 600 MG tablet Take 1 tablet (600 mg total) by mouth every 8 (eight) hours as needed for moderate pain. (Patient not taking: Reported on 01/31/2018) 30 tablet 1   Facility-Administered Medications Prior to Visit  Medication Dose Route Frequency Provider Last Rate Last Dose  . 0.9 %  sodium chloride infusion  500 mL Intravenous Continuous Gatha Mayer, MD        Allergies  Allergen Reactions  . Septra [Sulfamethoxazole-Trimethoprim] Itching and Rash       Objective:    BP 115/73 (BP Location: Left Arm, Patient Position: Sitting, Cuff Size: Normal)   Pulse 77   Temp 99.1 F (37.3 C) (Oral)   Ht _0  (1.499 m)   Wt 141 lb 12.8 oz (64.3 kg)   SpO2 97%   BMI 28.64 kg/m  Wt Readings from Last 3 Encounters:  02/07/18 141 lb 12.8 oz (64.3 kg)  01/02/18 140 lb 12.8 oz (63.9 kg)  03/22/17 138 lb (62.6 kg)    Physical Exam  Constitutional: She is oriented to person, place, and time. She appears well-developed and well-nourished. She is cooperative.  HENT:  Head: Normocephalic and atraumatic.  Eyes: EOM are normal.  Neck: Normal range of motion.  Cardiovascular: Normal rate, regular rhythm and normal heart sounds. Exam reveals no gallop and no friction rub.  No murmur heard. Pulmonary/Chest: Effort normal and breath sounds normal. No tachypnea. No respiratory distress. She has no decreased breath sounds. She has no wheezes. She has no rhonchi. She has no rales. She exhibits no tenderness.  Abdominal: Soft. Bowel sounds are normal.  Musculoskeletal: Normal range of motion. She exhibits no edema.  Neurological: She is alert and oriented to person, place, and time. Coordination normal.  Skin: Skin is warm  and dry. Rash noted. Rash is macular.  Moderate redness of bilateral cheeks. No papules or pustular nodules noted at this time.   Psychiatric: She has a normal mood and affect. Her behavior is normal. Judgment and thought content normal.  Nursing note and vitals reviewed.      Patient has been counseled extensively about nutrition and exercise as well as the importance of adherence with medications and regular follow-up. The patient was given clear instructions to go to ER or return to medical center if symptoms don't improve, worsen or new problems develop. The patient verbalized understanding.   Follow-up: No follow-ups on file.   Gildardo Pounds, FNP-BC [facility] and [place], [place]   02/07/2018, 5:03 PM

## 2018-02-07 NOTE — Patient Instructions (Signed)
Distensin muscular. (Muscle Strain) Una distensin muscular (estiramiento muscular) ocurre cuando un msculo se estira ms all de la longitud normal. Mahlon Gammon cuando una fuerza violenta bruscamente estira demasiado el msculo. Generalmente se desgarran algunas de las fibras del msculo. La distensin muscular es comn en los atletas. La recuperacin normalmente tarda de 1 a 2semanas. La curacin completa tarda de 5 a 6semanas. CUIDADOS EN EL HOGAR  Siga el mtodo PRICE (por sus siglas en ingls) de tratamiento para que la lesin mejore. Hgalo durante los 2 a 3 primeros das despus de la lesin: ? Engineer, maintenance. Proteja el msculo para evitar que se vuelva a lesionar. ? Reposo. Limite la actividad y descanse la parte del cuerpo lesionada. ? Hielo. Ponga el hielo en una bolsa plstica. Coloque una toalla entre la piel y la bolsa de hielo. Luego aplique el hielo y djelo actuar de 32 a 68minutos por hora. Despus del Building control surveyor, cambie a compresas de calor hmedo. ? Compresin. Use una frula o venda elstica en la zona lesionada para brindar alivio. No la ajuste demasiado. ? Elevacin. Eleve la zona lesionada por encima del nivel del corazn.  Solo tome los medicamentos que le haya indicado su mdico.  Realice un calentamiento antes de hacer ejercicio para prevenir distensiones musculares futuras.  SOLICITE AYUDA SI:  Siente ms dolor o inflamacin (hinchazn) en la zona lesionada.  Siente adormecimiento, hormigueo o nota una prdida de fuerza en la zona lesionada.  ASEGRESE DE QUE:  Comprende estas instrucciones.  Controlar su afeccin.  Recibir ayuda de inmediato si no mejora o si empeora.  Esta informacin no tiene Marine scientist el consejo del mdico. Asegrese de hacerle al mdico cualquier pregunta que tenga. Document Released: 12/30/2008 Document Revised: 07/24/2013 Document Reviewed: 05/02/2013 Elsevier Interactive Patient Education  2017 [employer].

## 2018-02-08 LAB — CMP14+EGFR
ALT: 14 IU/L (ref 0–32)
AST: 16 IU/L (ref 0–40)
Albumin/Globulin Ratio: 1.8 (ref 1.2–2.2)
Albumin: 4.4 g/dL (ref 3.5–4.8)
Alkaline Phosphatase: 83 IU/L (ref 39–117)
BUN / CREAT RATIO: 25 (ref 12–28)
BUN: 21 mg/dL (ref 8–27)
Bilirubin Total: 0.2 mg/dL (ref 0.0–1.2)
CO2: 24 mmol/L (ref 20–29)
CREATININE: 0.84 mg/dL (ref 0.57–1.00)
Calcium: 9.1 mg/dL (ref 8.7–10.3)
Chloride: 106 mmol/L (ref 96–106)
GFR, EST AFRICAN AMERICAN: 81 mL/min/{1.73_m2} (ref >59)
GFR, EST NON AFRICAN AMERICAN: 71 mL/min/{1.73_m2} (ref >59)
GLUCOSE: 103 mg/dL — AB (ref 65–99)
Globulin, Total: 2.5 g/dL (ref 1.5–4.5)
Potassium: 4.4 mmol/L (ref 3.5–5.2)
Sodium: 144 mmol/L (ref 134–144)
TOTAL PROTEIN: 6.9 g/dL (ref 6.0–8.5)

## 2018-02-08 LAB — CBC
Hematocrit: 40.1 % (ref 34.0–46.6)
Hemoglobin: 13 g/dL (ref 11.1–15.9)
MCH: 30.2 pg (ref 26.6–33.0)
MCHC: 32.4 g/dL (ref 31.5–35.7)
MCV: 93 fL (ref 79–97)
PLATELETS: 279 10*3/uL (ref 150–379)
RBC: 4.31 x10E6/uL (ref 3.77–5.28)
RDW: 14 % (ref 12.3–15.4)
WBC: 12.7 10*3/uL — AB (ref 3.4–10.8)

## 2018-02-08 LAB — LIPID PANEL
CHOL/HDL RATIO: 4.1 ratio (ref 0.0–4.4)
Cholesterol, Total: 208 mg/dL — ABNORMAL HIGH (ref 100–199)
HDL: 51 mg/dL (ref >39)
LDL CALC: 125 mg/dL — AB (ref 0–99)
Triglycerides: 159 mg/dL — ABNORMAL HIGH (ref 0–149)
VLDL Cholesterol Cal: 32 mg/dL (ref 5–40)

## 2018-02-08 LAB — VITAMIN D 25 HYDROXY (VIT D DEFICIENCY, FRACTURES): VIT D 25 HYDROXY: 17.3 ng/mL — AB (ref 30.0–100.0)

## 2018-02-09 ENCOUNTER — Other Ambulatory Visit: Payer: Self-pay | Admitting: Nurse Practitioner

## 2018-02-09 ENCOUNTER — Telehealth: Payer: Self-pay

## 2018-02-09 MED ORDER — VITAMIN D (ERGOCALCIFEROL) 1.25 MG (50000 UNIT) PO CAPS: 50000.0000 [IU] | ORAL_CAPSULE | ORAL | 1 refills | Status: DC

## 2018-02-09 MED ORDER — OMEGA-3-ACID ETHYL ESTERS 1 G PO CAPS: 1.0000 g | ORAL_CAPSULE | Freq: Two times a day (BID) | ORAL | 2 refills | Status: DC

## 2018-02-09 NOTE — Telephone Encounter (Signed)
CMA attempt to call patient to inform on lab results. No answer and left a VM for patient to call back.   If patient call back, please inform: Vitamin D is still low. Will send in script for weekly vitamin d.  WBC is slightly elevated. Please make a lab appointment for weeks to retest.  Tests show increased cholesterol/lipid levels. Will refill your lopid  or cholesterol/lipid lowering medication. Prescription has been sent to the pharmacy. Patient should work on a low fat, heart healthy diet and participate in regular aerobic exercise program to control as well by working out at least 150 minutes per week. No fried foods. No junk foods, sodas, sugary drinks, unhealthy snacking, or smoking.  Spanish interpreter [id number] assist with the call.

## 2018-02-09 NOTE — Telephone Encounter (Signed)
-----   Message from Gildardo Pounds, NP sent at 02/09/2018  1:09 AM EDT ----- Vitamin D is still low. Will send in script for weekly vitamin d.  WBC is slightly elevated. Please make a lab appointment for weeks to retest.  Tests show increased cholesterol/lipid levels. Will refill your lopid  or cholesterol/lipid lowering medication. Prescription has been sent to the pharmacy. Patient should work on a low fat, heart healthy diet and participate in regular aerobic exercise program to control as well by working out at least 150 minutes per week. No fried foods. No junk foods, sodas, sugary drinks, unhealthy snacking, or smoking.

## 2018-03-14 ENCOUNTER — Ambulatory Visit (INDEPENDENT_AMBULATORY_CARE_PROVIDER_SITE_OTHER): Payer: No Typology Code available for payment source | Admitting: Podiatry

## 2018-03-14 DIAGNOSIS — L600 Ingrowing nail: Secondary | ICD-10-CM

## 2018-03-14 DIAGNOSIS — M76821 Posterior tibial tendinitis, right leg: Secondary | ICD-10-CM

## 2018-03-14 DIAGNOSIS — M76822 Posterior tibial tendinitis, left leg: Secondary | ICD-10-CM

## 2018-03-14 MED ORDER — CLOTRIMAZOLE-BETAMETHASONE 1-0.05 % EX CREA: 1.0000 "application " | TOPICAL_CREAM | Freq: Two times a day (BID) | CUTANEOUS | 1 refills | Status: DC

## 2018-03-16 ENCOUNTER — Ambulatory Visit: Payer: Self-pay | Attending: Nurse Practitioner

## 2018-03-18 NOTE — Progress Notes (Signed)
   Subjective: Patient presents today for evaluation of pain to the medial and lateral borders of the right hallux that began about a year ago. She opted for conservative treatment at the previous visit and reports continued pain since. She also reports continue pain in the bilateral heels. She states the injections lasted for a few days but then the pain returned. Patient presents today for further treatment and evaluation.   Past Medical History:  Diagnosis Date  . 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 Hypotension   . Pain in back    pain in waist/ back- wakes with this pain- underwear hurts at night to sleep , pain increases with activity- happening for years now   . Pain in lower limb    calf pain  in both legs  . Pneumonia 11/26/2013    Objective:  General: Well developed, nourished, in no acute distress, alert and oriented x3   Dermatology: Skin is warm, dry and supple bilateral. Medial and lateral borders of the right hallux appears to be erythematous with evidence of an ingrowing nail. Pain on palpation noted to the border of the nail fold. Pruritus noted to the right foot with hyperkeratosis. There are no open sores, lesions.  Vascular: Dorsalis Pedis artery and Posterior Tibial artery pedal pulses palpable. No lower extremity edema noted.   Neruologic: Grossly intact via light touch bilateral.  Musculoskeletal: Pain on palpation noted to the posterior tibial tendon of the bilateral feet. Muscular strength within normal limits in all groups bilateral. Normal range of motion noted to all pedal and ankle joints.   Assesement: #1 Paronychia with ingrowing nail medial and lateral borders of the right hallux #2 Pain in toe #3 Incurvated nail #4 PT tendinitis bilateral #5 tinea pedis right   Plan of Care:  1. Patient evaluated.  2. Discussed treatment alternatives and plan of care. Explained nail avulsion procedure  and post procedure course to patient. 3. Patient opted for permanent partial nail avulsion.  4. Prior to procedure, local anesthesia infiltration utilized using 3 ml of a 50:50 mixture of 2% plain lidocaine and 0.5% plain marcaine in a normal hallux block fashion and a betadine prep performed.  5. Partial permanent nail avulsion with chemical matrixectomy performed using [id number] applications of phenol followed by alcohol flush.  6. Light dressing applied. 7. Injection of 0.5 mLs Celestone Soluspan injected into the posterior tibial tendon sheath of bilateral lower extremities.  8. Prescription for Lotrisone cream provided to patient.  9. Return to clinic in 2 weeks.   Edrick Kins, DPM [facility]  Dr. Edrick Kins, [address]                                        [place], [place] [address]                Office [phone]  Fax [phone]

## 2018-03-19 ENCOUNTER — Telehealth: Payer: Self-pay | Admitting: *Deleted

## 2018-03-19 MED ORDER — CLOTRIMAZOLE-BETAMETHASONE 1-0.05 % EX CREA: 1.0000 "application " | TOPICAL_CREAM | Freq: Two times a day (BID) | CUTANEOUS | 1 refills | Status: DC

## 2018-03-19 NOTE — Telephone Encounter (Signed)
-----   Message from Marlou Sa sent at 03/16/2018  9:52 AM EDT ----- Regarding: rx Patient and her husband came in stating that Dr. Amalia Hailey didn't call the rx he was supposed to the correct pharmacy. Both patients do not speak English well and I confirmed that they wanted the rx sent to [facility] on [address]. Patient has [place]. She was seen 03/14/18.Marland KitchenMarland Kitchenplease advise

## 2018-03-19 NOTE — Telephone Encounter (Signed)
I informed female voice pt's rx had been called to the [facility], he states he will inform pt.

## 2018-04-02 ENCOUNTER — Ambulatory Visit: Payer: No Typology Code available for payment source | Admitting: Podiatry

## 2018-04-09 ENCOUNTER — Ambulatory Visit (INDEPENDENT_AMBULATORY_CARE_PROVIDER_SITE_OTHER): Payer: No Typology Code available for payment source | Admitting: Podiatry

## 2018-04-09 DIAGNOSIS — L600 Ingrowing nail: Secondary | ICD-10-CM

## 2018-04-13 NOTE — Progress Notes (Signed)
   Subjective: Patient presents today 2 weeks post ingrown nail permanent nail avulsion procedure of the medial and lateral border of the right hallux. Patient states that the toe and nail fold is feeling much better. Patient is here for further evaluation and treatment.   Past Medical History:  Diagnosis Date  . 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 Hypotension   . Pain in back    pain in waist/ back- wakes with this pain- underwear hurts at night to sleep , pain increases with activity- happening for years now   . Pain in lower limb    calf pain  in both legs  . Pneumonia 11/26/2013    Objective: Skin is warm, dry and supple. Nail and respective nail fold appears to be healing appropriately. Open wound to the associated nail fold with a granular wound base and moderate amount of fibrotic tissue. Minimal drainage noted. Mild erythema around the periungual region likely due to phenol chemical matricectomy.  Assessment: #1 postop permanent partial nail avulsion medial and lateral border right hallux  #2 open wound periungual nail fold of respective digit.   Plan of care: #1 patient was evaluated  #2 debridement of open wound was performed to the periungual border of the respective toe using a currette. Antibiotic ointment and Band-Aid was applied. #3 patient is to return to clinic on a PRN basis.   Edrick Kins, DPM [facility]  Dr. Edrick Kins, [address]                                        [place], [place] [address]                Office [phone]  Fax [phone]

## 2018-08-17 ENCOUNTER — Ambulatory Visit: Payer: Self-pay | Attending: Nurse Practitioner | Admitting: Nurse Practitioner

## 2018-08-17 ENCOUNTER — Encounter: Payer: Self-pay | Admitting: Nurse Practitioner

## 2018-08-17 VITALS — BP 135/74 | HR 66 | Temp 98.5°F | Ht 59.0 in | Wt 144.0 lb

## 2018-08-17 DIAGNOSIS — M25552 Pain in left hip: Secondary | ICD-10-CM | POA: Insufficient documentation

## 2018-08-17 DIAGNOSIS — M25551 Pain in right hip: Secondary | ICD-10-CM | POA: Insufficient documentation

## 2018-08-17 DIAGNOSIS — E782 Mixed hyperlipidemia: Secondary | ICD-10-CM | POA: Insufficient documentation

## 2018-08-17 DIAGNOSIS — Z79899 Other long term (current) drug therapy: Secondary | ICD-10-CM | POA: Insufficient documentation

## 2018-08-17 DIAGNOSIS — Z882 Allergy status to sulfonamides status: Secondary | ICD-10-CM | POA: Insufficient documentation

## 2018-08-17 DIAGNOSIS — G8929 Other chronic pain: Secondary | ICD-10-CM | POA: Insufficient documentation

## 2018-08-17 DIAGNOSIS — E559 Vitamin D deficiency, unspecified: Secondary | ICD-10-CM | POA: Insufficient documentation

## 2018-08-17 MED ORDER — MELOXICAM 15 MG PO TABS: 15.0000 mg | ORAL_TABLET | Freq: Every day | ORAL | 3 refills | Status: AC

## 2018-08-17 MED ORDER — OMEGA-3-ACID ETHYL ESTERS 1 G PO CAPS: 1.0000 g | ORAL_CAPSULE | Freq: Two times a day (BID) | ORAL | 2 refills | Status: DC

## 2018-08-17 MED ORDER — VITAMIN D (ERGOCALCIFEROL) 1.25 MG (50000 UNIT) PO CAPS: 50000.0000 [IU] | ORAL_CAPSULE | ORAL | 1 refills | Status: DC

## 2018-08-17 NOTE — Progress Notes (Signed)
Assessment & Plan:  Bianca Henderson was seen today for follow-up.  Diagnoses and all orders for this visit:  Chronic hip pain, bilateral -     Ambulatory referral to Podiatry -     meloxicam (MOBIC) 15 MG tablet; Take 1 tablet (15 mg total) by mouth daily. Work on losing weight to help reduce joint pain. May alternate with heat and ice application for pain relief. May also alternate with acetaminophen as prescribed pain relief. Other alternatives include massage, acupuncture and water aerobics.  You must stay active and avoid a sedentary lifestyle.   Mixed hyperlipidemia -     omega-3 acid ethyl esters (LOVAZA) 1 g capsule; Take 1 capsule (1 g total) by mouth 2 (two) times daily. -     Lipid panel INSTRUCTIONS: Work on a low fat, heart healthy diet and participate in regular aerobic exercise program by working out at least 150 minutes per week; 5 days a week-30 minutes per day. Avoid red meat, fried foods. junk foods, sodas, sugary drinks, unhealthy snacking, alcohol and smoking.  Drink at least 48oz of water per day and monitor your carbohydrate intake daily.  Lab Results  Component Value Date   LDLCALC 122 (H) 08/17/2018  LDL not at goal.   Vitamin D deficiency -     Vitamin D, Ergocalciferol, (DRISDOL) 50000 units CAPS capsule; Take 1 capsule (50,000 Units total) by mouth every 7 (seven) days.    Patient has been counseled on age-appropriate routine health concerns for screening and prevention. These are reviewed and up-to-date. Referrals have been placed accordingly. Immunizations are up-to-date or declined.    Subjective:   Chief Complaint  Patient presents with  . Follow-up    hip pain   HPI Bianca Henderson 72 y.o. female presents to office today with complaints of hip pain. VRI was used to communicate directly with patient for the entire encounter including providing detailed patient instructions.    Hip Pain She endorses bilateral knee and hip pain. This is chronic and she also has  a history of generalized pain. She had a prescription for meloxicam which provided relief of her pain however her prescription ran out. Will refill today. Pain is described as sharp and aching. Aggravating factors: changes in temperature (cold weather) and rain. She denies any falls. May need xray if pain not controlled with mobic.    Review of Systems  Constitutional: Negative for fever, malaise/fatigue and weight loss.  HENT: Negative.  Negative for nosebleeds.   Eyes: Negative.  Negative for blurred vision, double vision and photophobia.  Respiratory: Negative.  Negative for cough and shortness of breath.   Cardiovascular: Negative.  Negative for chest pain, palpitations and leg swelling.  Gastrointestinal: Negative.  Negative for heartburn, nausea and vomiting.  Musculoskeletal: Positive for back pain and joint pain. Negative for falls and myalgias.       SEE HPI  Neurological: Negative.  Negative for dizziness, focal weakness, seizures and headaches.  Psychiatric/Behavioral: Negative.  Negative for suicidal ideas.    Past Medical History:  Diagnosis Date  . 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 Hypotension   . Pain in back    pain in waist/ back- wakes with this pain- underwear hurts at night to sleep , pain increases with activity- happening for years now   . Pain in lower limb    calf pain  in both legs  . Pneumonia 11/26/2013  Past Surgical History:  Procedure Laterality Date  . APPENDECTOMY    . SMALL INTESTINE SURGERY  2002   remove cancer  . tubal ligation      Family History  Problem Relation Age of Onset  . Breast cancer Sister   . Colon cancer Neg Hx   . Colon polyps Neg Hx     Social History Reviewed with no changes to be made today.   Outpatient Medications Prior to Visit  Medication Sig Dispense Refill  . clotrimazole-betamethasone (LOTRISONE) cream Apply 1 application topically 2 (two)  times daily. 30 g 1  . acetaminophen (TYLENOL) 500 MG tablet Take 1,000 mg by mouth every 8 (eight) hours as needed for moderate pain.    Marland Kitchen gemfibrozil (LOPID) 600 MG tablet Take 1 tablet (600 mg total) by mouth 2 (two) times daily before a meal. (Patient not taking: Reported on 09/12/2016) 60 tablet 3  . hydrOXYzine (ATARAX/VISTARIL) 10 MG tablet Take 1 tablet (10 mg total) by mouth 3 (three) times daily as needed. (Patient not taking: Reported on 01/31/2018) 30 tablet 0  . metroNIDAZOLE (METROGEL) 0.75 % gel Apply 1 application topically 2 (two) times daily. (Patient not taking: Reported on 08/17/2018) 45 g 0  . omega-3 acid ethyl esters (LOVAZA) 1 g capsule Take 1 capsule (1 g total) by mouth 2 (two) times daily. (Patient not taking: Reported on 08/17/2018) 60 capsule 2  . Vitamin D, Ergocalciferol, (DRISDOL) 50000 units CAPS capsule Take 1 capsule (50,000 Units total) by mouth every 7 (seven) days. (Patient not taking: Reported on 08/17/2018) 12 capsule 1   Facility-Administered Medications Prior to Visit  Medication Dose Route Frequency Provider Last Rate Last Dose  . 0.9 %  sodium chloride infusion  500 mL Intravenous Continuous Gatha Mayer, MD        Allergies  Allergen Reactions  . Septra [Sulfamethoxazole-Trimethoprim] Itching and Rash       Objective:    BP 135/74 (BP Location: Right Arm, Patient Position: Sitting, Cuff Size: Normal)   Pulse 66   Temp 98.5 F (36.9 C) (Oral)   Ht 4\' 11"  (1.499 m)   Wt 144 lb (65.3 kg)   SpO2 97%   BMI 29.08 kg/m  Wt Readings from Last 3 Encounters:  08/17/18 144 lb (65.3 kg)  02/07/18 141 lb 12.8 oz (64.3 kg)  01/02/18 140 lb 12.8 oz (63.9 kg)    Physical Exam  Constitutional: She is oriented to person, place, and time. She appears well-developed and well-nourished. She is cooperative.  HENT:  Head: Normocephalic and atraumatic.  Eyes: EOM are normal.  Neck: Normal range of motion.  Cardiovascular: Normal rate, regular rhythm and  normal heart sounds. Exam reveals no gallop and no friction rub.  No murmur heard. Pulmonary/Chest: Effort normal and breath sounds normal. No tachypnea. No respiratory distress. She has no decreased breath sounds. She has no wheezes. She has no rhonchi. She has no rales. She exhibits no tenderness.  Abdominal: Soft. Bowel sounds are normal.  Musculoskeletal: Normal range of motion. She exhibits no edema, tenderness or deformity.       Right hip: She exhibits no tenderness, no bony tenderness and no swelling.       Left hip: She exhibits no tenderness, no bony tenderness and no swelling.       Right knee: She exhibits no swelling.       Left knee: She exhibits no swelling.  Neurological: She is alert and oriented to person, place, and time.  Coordination normal.  Skin: Skin is warm and dry.  Psychiatric: She has a normal mood and affect. Her behavior is normal. Judgment and thought content normal.  Nursing note and vitals reviewed.      Patient has been counseled extensively about nutrition and exercise as well as the importance of adherence with medications and regular follow-up. The patient was given clear instructions to go to ER or return to medical center if symptoms don't improve, worsen or new problems develop. The patient verbalized understanding.   Follow-up: Return in about 6 weeks (around 09/28/2018) for bilateral hip pain.   Gildardo Pounds, FNP-BC [facility] and [place], [place]   08/19/2018, 4:01 PM

## 2018-08-17 NOTE — Patient Instructions (Signed)
Tylenol PM  Or Melatonin you can take up to 3 tablets at night.

## 2018-08-18 LAB — LIPID PANEL
Chol/HDL Ratio: 6.1 ratio — ABNORMAL HIGH (ref 0.0–4.4)
Cholesterol, Total: 212 mg/dL — ABNORMAL HIGH (ref 100–199)
HDL: 35 mg/dL — ABNORMAL LOW (ref >39)
LDL Calculated: 122 mg/dL — ABNORMAL HIGH (ref 0–99)
Triglycerides: 277 mg/dL — ABNORMAL HIGH (ref 0–149)
VLDL CHOLESTEROL CAL: 55 mg/dL — AB (ref 5–40)

## 2018-08-19 ENCOUNTER — Encounter: Payer: Self-pay | Admitting: Nurse Practitioner

## 2018-08-21 ENCOUNTER — Telehealth: Payer: Self-pay

## 2018-08-21 NOTE — Telephone Encounter (Signed)
-----   Message from Gildardo Pounds, NP sent at 08/19/2018  4:17 PM EST ----- Lipid panel is still elevated. Please make sure you are taking your cholesterol medication every day as prescribed. Will recheck your levels at your next office visit in December. If levels are elevated we will need to place you on another cholesterol medication.

## 2018-08-21 NOTE — Telephone Encounter (Addendum)
CMA attempt to reach patient to inform on results.  No answer and left a VM for patient to call back.  If patient call back, please inform:  Lipid panel is still elevated. Please make sure you are taking your cholesterol medication every day as prescribed. Will recheck your levels at your next office visit in December. If levels are elevated we will need to place you on another cholesterol medication.  A letter will be send out to reach patient.

## 2018-09-07 ENCOUNTER — Other Ambulatory Visit (HOSPITAL_COMMUNITY): Payer: Self-pay | Admitting: *Deleted

## 2018-09-07 DIAGNOSIS — Z1231 Encounter for screening mammogram for malignant neoplasm of breast: Secondary | ICD-10-CM

## 2018-09-27 ENCOUNTER — Ambulatory Visit: Payer: Self-pay

## 2018-09-28 ENCOUNTER — Ambulatory Visit: Payer: Self-pay | Attending: Nurse Practitioner | Admitting: Nurse Practitioner

## 2018-09-28 ENCOUNTER — Encounter: Payer: Self-pay | Admitting: Nurse Practitioner

## 2018-09-28 VITALS — BP 111/64 | HR 52 | Temp 98.0°F | Ht 59.0 in | Wt 142.4 lb

## 2018-09-28 DIAGNOSIS — M722 Plantar fascial fibromatosis: Secondary | ICD-10-CM

## 2018-09-28 DIAGNOSIS — Z882 Allergy status to sulfonamides status: Secondary | ICD-10-CM | POA: Insufficient documentation

## 2018-09-28 DIAGNOSIS — M79672 Pain in left foot: Secondary | ICD-10-CM

## 2018-09-28 DIAGNOSIS — Z79899 Other long term (current) drug therapy: Secondary | ICD-10-CM | POA: Insufficient documentation

## 2018-09-28 DIAGNOSIS — Z791 Long term (current) use of non-steroidal anti-inflammatories (NSAID): Secondary | ICD-10-CM | POA: Insufficient documentation

## 2018-09-28 DIAGNOSIS — M25551 Pain in right hip: Secondary | ICD-10-CM

## 2018-09-28 DIAGNOSIS — Z9889 Other specified postprocedural states: Secondary | ICD-10-CM | POA: Insufficient documentation

## 2018-09-28 DIAGNOSIS — M79671 Pain in right foot: Secondary | ICD-10-CM

## 2018-09-28 DIAGNOSIS — I959 Hypotension, unspecified: Secondary | ICD-10-CM | POA: Insufficient documentation

## 2018-09-28 DIAGNOSIS — M25552 Pain in left hip: Secondary | ICD-10-CM | POA: Insufficient documentation

## 2018-09-28 MED ORDER — METHOCARBAMOL 500 MG PO TABS
500.0000 mg | ORAL_TABLET | Freq: Three times a day (TID) | ORAL | 1 refills | Status: AC
Start: 2018-09-28 — End: 2018-10-28

## 2018-09-28 MED ORDER — MELOXICAM 15 MG PO TABS: 15.0000 mg | ORAL_TABLET | Freq: Every day | ORAL | 1 refills | Status: DC

## 2018-09-28 MED ORDER — ACETAMINOPHEN 500 MG PO TABS: 1000.0000 mg | ORAL_TABLET | Freq: Two times a day (BID) | ORAL | 1 refills | Status: AC | PRN

## 2018-09-28 NOTE — Progress Notes (Signed)
Assessment & Plan:  Bianca Henderson was seen today for hip pain.  Diagnoses and all orders for this visit:  Bilateral hip pain -     methocarbamol (ROBAXIN) 500 MG tablet; Take 1 tablet (500 mg total) by mouth 3 (three) times daily. -     meloxicam (MOBIC) 15 MG tablet; Take 1 tablet (15 mg total) by mouth daily. -     acetaminophen (TYLENOL) 500 MG tablet; Take 2 tablets (1,000 mg total) by mouth every 12 (twelve) hours as needed for moderate pain. Work on losing weight to help reduce joint pain. May alternate with heat and ice application for pain relief. May also alternate with acetaminophen for pain relief. Other alternatives include massage, acupuncture and water aerobics.  You must stay active and avoid a sedentary lifestyle.  Plantar fasciitis I have called the podiatrist office and scheduled patient for an appointment on 10-05-2018 at 1045 .    Patient has been counseled on age-appropriate routine health concerns for screening and prevention. These are reviewed and up-to-date. Referrals have been placed accordingly. Immunizations are up-to-date or declined.    Subjective:   Chief Complaint  Patient presents with  . Hip Pain   HPI Bianca Henderson 72 y.o. female presents to office today regarding bilateral hip pain and fasciitis.  VRI was used to communicate directly with patient for the entire encounter including providing detailed patient instructions.   Bilateral Hip Pain She was prescribed meloxicam for bilateral hip pain at her last appt several weeks ago. Today she endorses noticeable improvement in her symptoms however she continues with some minor back pain as well. There is no sciatica present. Pain is chronic in nature and intermittent. Worse with prolonged sitting or standing. I have encouraged her to only use meloxicam for severe pain due to risk of GI irritation and bleed with prolonged use of NSAIDS.  Plantar Fasciitis Re occurring. Chronic. Pain is mostly in both heels.  She has been treated by podiatry in the past. Unfortunately she was recently referred to podiatry and they have been trying to reach her unsuccessfully. She reports attempting to call them back however there is a communication barrier as she does not speak [place].   Review of Systems  Constitutional: Negative for fever, malaise/fatigue and weight loss.  HENT: Negative.  Negative for nosebleeds.   Eyes: Negative.  Negative for blurred vision, double vision and photophobia.  Respiratory: Negative.  Negative for cough and shortness of breath.   Cardiovascular: Negative.  Negative for chest pain, palpitations and leg swelling.  Gastrointestinal: Negative.  Negative for heartburn, nausea and vomiting.  Musculoskeletal: Positive for back pain and joint pain. Negative for myalgias.       SEE HPI  Neurological: Negative.  Negative for dizziness, focal weakness, seizures and headaches.  Psychiatric/Behavioral: Negative.  Negative for suicidal ideas.    Past Medical History:  Diagnosis Date  . 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 Hypotension   . Pain in back    pain in waist/ back- wakes with this pain- underwear hurts at night to sleep , pain increases with activity- happening for years now   . Pain in lower limb    calf pain  in both legs  . Pneumonia 11/26/2013    Past Surgical History:  Procedure Laterality Date  . APPENDECTOMY    . SMALL INTESTINE SURGERY  2002   remove cancer  . tubal  ligation      Family History  Problem Relation Age of Onset  . Breast cancer Sister   . Colon cancer Neg Hx   . Colon polyps Neg Hx     Social History Reviewed with no changes to be made today.   Outpatient Medications Prior to Visit  Medication Sig Dispense Refill  . gemfibrozil (LOPID) 600 MG tablet Take 1 tablet (600 mg total) by mouth 2 (two) times daily before a meal. (Patient not taking: Reported on 09/12/2016) 60 tablet 3  .  omega-3 acid ethyl esters (LOVAZA) 1 g capsule Take 1 capsule (1 g total) by mouth 2 (two) times daily. (Patient not taking: Reported on 09/28/2018) 60 capsule 2  . Vitamin D, Ergocalciferol, (DRISDOL) 50000 units CAPS capsule Take 1 capsule (50,000 Units total) by mouth every 7 (seven) days. (Patient not taking: Reported on 09/28/2018) 12 capsule 1  . acetaminophen (TYLENOL) 500 MG tablet Take 1,000 mg by mouth every 8 (eight) hours as needed for moderate pain.    . clotrimazole-betamethasone (LOTRISONE) cream Apply 1 application topically 2 (two) times daily. (Patient not taking: Reported on 09/28/2018) 30 g 1  . hydrOXYzine (ATARAX/VISTARIL) 10 MG tablet Take 1 tablet (10 mg total) by mouth 3 (three) times daily as needed. (Patient not taking: Reported on 01/31/2018) 30 tablet 0   Facility-Administered Medications Prior to Visit  Medication Dose Route Frequency Provider Last Rate Last Dose  . 0.9 %  sodium chloride infusion  500 mL Intravenous Continuous Gatha Mayer, MD        Allergies  Allergen Reactions  . Septra [Sulfamethoxazole-Trimethoprim] Itching and Rash       Objective:    BP 111/64   Pulse (!) 52   Temp 98 F (36.7 C) (Oral)   Ht 4\' 11"  (1.499 m)   Wt 142 lb 6.4 oz (64.6 kg)   SpO2 98%   BMI 28.76 kg/m  Wt Readings from Last 3 Encounters:  09/28/18 142 lb 6.4 oz (64.6 kg)  08/17/18 144 lb (65.3 kg)  02/07/18 141 lb 12.8 oz (64.3 kg)    Physical Exam Vitals signs and nursing note reviewed.  Constitutional:      Appearance: She is well-developed.  HENT:     Head: Normocephalic and atraumatic.  Neck:     Musculoskeletal: Normal range of motion.  Cardiovascular:     Rate and Rhythm: Regular rhythm. Bradycardia present.     Heart sounds: Normal heart sounds. No murmur. No friction rub. No gallop.   Pulmonary:     Effort: Pulmonary effort is normal. No tachypnea or respiratory distress.     Breath sounds: Normal breath sounds. No decreased breath sounds,  wheezing, rhonchi or rales.  Chest:     Chest wall: No tenderness.  Abdominal:     General: Bowel sounds are normal.     Palpations: Abdomen is soft.  Musculoskeletal: Normal range of motion.  Skin:    General: Skin is warm and dry.  Neurological:     Mental Status: She is alert and oriented to person, place, and time.     Coordination: Coordination normal.  Psychiatric:        Behavior: Behavior normal. Behavior is cooperative.        Thought Content: Thought content normal.        Judgment: Judgment normal.          Patient has been counseled extensively about nutrition and exercise as well as the importance of adherence with  medications and regular follow-up. The patient was given clear instructions to go to ER or return to medical center if symptoms don't improve, worsen or new problems develop. The patient verbalized understanding.   Follow-up: Return in about 4 weeks (around 10/26/2018) for hip and back pain.   Gildardo Pounds, FNP-BC [facility] and [facility], [place]   10/03/2018, 9:18 AM

## 2018-10-03 ENCOUNTER — Encounter: Payer: Self-pay | Admitting: Nurse Practitioner

## 2018-10-05 ENCOUNTER — Ambulatory Visit: Payer: Self-pay | Admitting: Podiatry

## 2018-10-12 ENCOUNTER — Telehealth: Payer: Self-pay | Admitting: Nurse Practitioner

## 2018-10-12 NOTE — Telephone Encounter (Signed)
Patient wanted to check on the status of her [place] letter. Please follow up.

## 2018-10-16 NOTE — Telephone Encounter (Signed)
Pt was informed that will be sent the [place] letter via mail

## 2018-10-31 ENCOUNTER — Encounter: Payer: Self-pay | Admitting: Podiatry

## 2018-11-06 NOTE — Progress Notes (Signed)
This encounter was created in error - please disregard.

## 2018-11-08 ENCOUNTER — Ambulatory Visit (INDEPENDENT_AMBULATORY_CARE_PROVIDER_SITE_OTHER): Payer: No Typology Code available for payment source | Admitting: Podiatry

## 2018-11-08 ENCOUNTER — Encounter: Payer: Self-pay | Admitting: Podiatry

## 2018-11-08 DIAGNOSIS — M722 Plantar fascial fibromatosis: Secondary | ICD-10-CM

## 2018-11-08 NOTE — Patient Instructions (Signed)

## 2018-11-08 NOTE — Progress Notes (Signed)
Subjective:   Patient ID: Bianca Henderson, female   DOB: 73 y.o.   MRN: [id number]   HPI Patient states her heels are just starting to hurt her again   ROS      Objective:  Physical Exam  Neurovascular status intact with discomfort plantar heel region bilateral persisting in several years to recur     Assessment:  Plantar fasciitis bilateral     Plan:  Inject plantar fascial bilateral after sterile prep 3 mg Kenalog 5 g Xylocaine applied sterile dressing and instructed on supportive shoes and reappoint as needed

## 2018-11-13 ENCOUNTER — Ambulatory Visit: Payer: Self-pay | Attending: Nurse Practitioner | Admitting: Nurse Practitioner

## 2018-11-13 ENCOUNTER — Encounter: Payer: Self-pay | Admitting: Nurse Practitioner

## 2018-11-13 VITALS — BP 121/70 | HR 78 | Temp 98.1°F | Resp 18 | Ht 59.0 in | Wt 142.0 lb

## 2018-11-13 DIAGNOSIS — E782 Mixed hyperlipidemia: Secondary | ICD-10-CM

## 2018-11-13 DIAGNOSIS — M25551 Pain in right hip: Secondary | ICD-10-CM

## 2018-11-13 DIAGNOSIS — E559 Vitamin D deficiency, unspecified: Secondary | ICD-10-CM

## 2018-11-13 DIAGNOSIS — M25552 Pain in left hip: Secondary | ICD-10-CM

## 2018-11-13 MED ORDER — MELOXICAM 15 MG PO TABS: 15.0000 mg | ORAL_TABLET | Freq: Every day | ORAL | 0 refills | Status: AC

## 2018-11-13 MED ORDER — METHOCARBAMOL 500 MG PO TABS: 500.0000 mg | ORAL_TABLET | Freq: Three times a day (TID) | ORAL | 1 refills | Status: AC

## 2018-11-13 MED ORDER — OMEGA-3-ACID ETHYL ESTERS 1 G PO CAPS: 1.0000 g | ORAL_CAPSULE | Freq: Two times a day (BID) | ORAL | 6 refills | Status: AC

## 2018-11-13 NOTE — Progress Notes (Signed)
Assessment & Plan:  Bianca Henderson was seen today for follow-up.  Diagnoses and all orders for this visit:  Bilateral hip pain -     meloxicam (MOBIC) 15 MG tablet; Take 1 tablet (15 mg total) by mouth daily for 30 days. -     methocarbamol (ROBAXIN) 500 MG tablet; Take 1 tablet (500 mg total) by mouth 3 (three) times daily for 30 days. Work on losing weight to help reduce joint pain. May alternate with heat and ice application for pain relief. May also alternate with acetaminophen as prescribed pain relief. Other alternatives include massage, acupuncture and water aerobics.  You must stay active and avoid a sedentary lifestyle.   Mixed hyperlipidemia -     omega-3 acid ethyl esters (LOVAZA) 1 g capsule; Take 1 capsule (1 g total) by mouth 2 (two) times daily. -     Lipid panel INSTRUCTIONS: Work on a low fat, heart healthy diet and participate in regular aerobic exercise program by working out at least 150 minutes per week; 5 days a week-30 minutes per day. Avoid red meat, fried foods. junk foods, sodas, sugary drinks, unhealthy snacking, alcohol and smoking.  Drink at least 48oz of water per day and monitor your carbohydrate intake daily.    Vitamin D deficiency disease -     VITAMIN D 25 Hydroxy (Vit-D Deficiency, Fractures)    Patient has been counseled on age-appropriate routine health concerns for screening and prevention. These are reviewed and up-to-date. Referrals have been placed accordingly. Immunizations are up-to-date or declined.    Subjective:   Chief Complaint  Patient presents with  . Follow-up   HPI Bianca Henderson 73 y.o. female presents to office today for follow up to B/L hip pain and plantar fasciitis. VRI was used to communicate directly with patient for the entire encounter including providing detailed patient instructions.   Bilateral Hip Pain and Plantar Fasciitis She was evaluated by Dr. Paulla Dolly for chronic plantar fasciitis and received a kenalog injection on  11-08-2018.  She also endorses improved bilateral hip pain since she received the kenalog injection. She continues to take meloxicam and robaxin sparingly.   Hyperlipidemia Chronic and poorly controlled. Patient presents for follow up to hyperlipidemia.  She is medication compliant taking lovaza 1gm BID. She is diet compliant and denies statin intolerance including myalgias.  Lab Results  Component Value Date   CHOL 212 (H) 08/17/2018   Lab Results  Component Value Date   HDL 35 (L) 08/17/2018   Lab Results  Component Value Date   LDLCALC 122 (H) 08/17/2018   Lab Results  Component Value Date   TRIG 277 (H) 08/17/2018   Lab Results  Component Value Date   CHOLHDL 6.1 (H) 08/17/2018     Review of Systems  Constitutional: Negative for fever, malaise/fatigue and weight loss.  HENT: Negative.  Negative for nosebleeds.   Eyes: Negative.  Negative for blurred vision, double vision and photophobia.  Respiratory: Negative.  Negative for cough and shortness of breath.   Cardiovascular: Negative.  Negative for chest pain, palpitations and leg swelling.  Gastrointestinal: Negative.  Negative for heartburn, nausea and vomiting.  Musculoskeletal: Negative.  Negative for myalgias.  Neurological: Negative.  Negative for dizziness, focal weakness, seizures and headaches.  Psychiatric/Behavioral: Negative.  Negative for suicidal ideas.    Past Medical History:  Diagnosis Date  . 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 Hypotension   .  Pain in back    pain in waist/ back- wakes with this pain- underwear hurts at night to sleep , pain increases with activity- happening for years now   . Pain in lower limb    calf pain  in both legs  . Pneumonia 11/26/2013    Past Surgical History:  Procedure Laterality Date  . APPENDECTOMY    . SMALL INTESTINE SURGERY  2002   remove cancer  . tubal ligation      Family History  Problem  Relation Age of Onset  . Breast cancer Sister   . Colon cancer Neg Hx   . Colon polyps Neg Hx     Social History Reviewed with no changes to be made today.   Outpatient Medications Prior to Visit  Medication Sig Dispense Refill  . Vitamin D, Ergocalciferol, (DRISDOL) 50000 units CAPS capsule Take 1 capsule (50,000 Units total) by mouth every 7 (seven) days. 12 capsule 1  . meloxicam (MOBIC) 15 MG tablet Take 1 tablet (15 mg total) by mouth daily. 30 tablet 1  . omega-3 acid ethyl esters (LOVAZA) 1 g capsule Take 1 capsule (1 g total) by mouth 2 (two) times daily. 60 capsule 2   Facility-Administered Medications Prior to Visit  Medication Dose Route Frequency Provider Last Rate Last Dose  . 0.9 %  sodium chloride infusion  500 mL Intravenous Continuous Gatha Mayer, MD        Allergies  Allergen Reactions  . Septra [Sulfamethoxazole-Trimethoprim] Itching and Rash       Objective:    BP 121/70 (BP Location: Left Arm, Patient Position: Sitting, Cuff Size: Normal)   Pulse 78   Temp 98.1 F (36.7 C) (Oral)   Resp 18   Ht 4\' 11"  (1.499 m)   Wt 142 lb (64.4 kg)   SpO2 95%   BMI 28.68 kg/m  Wt Readings from Last 3 Encounters:  11/13/18 142 lb (64.4 kg)  09/28/18 142 lb 6.4 oz (64.6 kg)  08/17/18 144 lb (65.3 kg)    Physical Exam Vitals signs and nursing note reviewed.  Constitutional:      Appearance: She is well-developed.  HENT:     Head: Normocephalic and atraumatic.  Neck:     Musculoskeletal: Normal range of motion.  Cardiovascular:     Rate and Rhythm: Normal rate and regular rhythm.     Heart sounds: Normal heart sounds. No murmur. No friction rub. No gallop.   Pulmonary:     Effort: Pulmonary effort is normal. No tachypnea or respiratory distress.     Breath sounds: Normal breath sounds. No decreased breath sounds, wheezing, rhonchi or rales.  Chest:     Chest wall: No tenderness.  Abdominal:     General: Bowel sounds are normal.     Palpations: Abdomen  is soft.  Musculoskeletal: Normal range of motion.  Skin:    General: Skin is warm and dry.  Neurological:     Mental Status: She is alert and oriented to person, place, and time.     Coordination: Coordination normal.  Psychiatric:        Behavior: Behavior normal. Behavior is cooperative.        Thought Content: Thought content normal.        Judgment: Judgment normal.          Patient has been counseled extensively about nutrition and exercise as well as the importance of adherence with medications and regular follow-up. The patient was given clear instructions to  go to ER or return to medical center if symptoms don't improve, worsen or new problems develop. The patient verbalized understanding.   Follow-up: Return if symptoms worsen or fail to improve.   Gildardo Pounds, FNP-BC [facility] and [facility], [place]   11/13/2018, 9:23 PM

## 2018-11-14 LAB — LIPID PANEL
Chol/HDL Ratio: 4.9 ratio — ABNORMAL HIGH (ref 0.0–4.4)
Cholesterol, Total: 237 mg/dL — ABNORMAL HIGH (ref 100–199)
HDL: 48 mg/dL (ref >39)
LDL Calculated: 158 mg/dL — ABNORMAL HIGH (ref 0–99)
Triglycerides: 156 mg/dL — ABNORMAL HIGH (ref 0–149)
VLDL Cholesterol Cal: 31 mg/dL (ref 5–40)

## 2018-11-14 LAB — VITAMIN D 25 HYDROXY (VIT D DEFICIENCY, FRACTURES): VIT D 25 HYDROXY: 12.1 ng/mL — AB (ref 30.0–100.0)

## 2018-11-21 ENCOUNTER — Other Ambulatory Visit: Payer: Self-pay | Admitting: Nurse Practitioner

## 2018-11-21 DIAGNOSIS — E559 Vitamin D deficiency, unspecified: Secondary | ICD-10-CM

## 2018-11-21 MED ORDER — VITAMIN D (ERGOCALCIFEROL) 1.25 MG (50000 UNIT) PO CAPS: 50000.0000 [IU] | ORAL_CAPSULE | ORAL | 1 refills | Status: AC

## 2018-11-21 MED ORDER — ROSUVASTATIN CALCIUM 10 MG PO TABS: 10.0000 mg | ORAL_TABLET | Freq: Every day | ORAL | 3 refills | Status: AC

## 2018-11-22 ENCOUNTER — Telehealth: Payer: Self-pay

## 2018-11-22 NOTE — Telephone Encounter (Signed)
CMA spoke to patient to inform on results.  Patient verified DOB.  Patient understood.  Spanish interpreter Rosetto [phone] assist with the call.

## 2018-11-22 NOTE — Telephone Encounter (Signed)
-----   Message from Gildardo Pounds, NP sent at 11/21/2018 10:12 PM EST ----- Vitamin d levels are still low and cholesterol levels are still high. You are increasing your risk of a heart attack or stroke if you do not take cholesterol medication. Will need to add an additional cholesterol medicine. I have sent your vitamin d and cholesterol medication to the pharmacy. You are to still continue on your omega 3 as well.

## 2018-12-06 ENCOUNTER — Ambulatory Visit
Admission: RE | Admit: 2018-12-06 | Discharge: 2018-12-06 | Disposition: A | Discharge status: Home / Self Care | Payer: No Typology Code available for payment source | Source: Ambulatory Visit | Attending: Obstetrics and Gynecology | Admitting: Obstetrics and Gynecology

## 2018-12-06 ENCOUNTER — Ambulatory Visit (HOSPITAL_COMMUNITY): Payer: Self-pay

## 2018-12-06 DIAGNOSIS — Z1231 Encounter for screening mammogram for malignant neoplasm of breast: Secondary | ICD-10-CM

## 2019-01-22 ENCOUNTER — Other Ambulatory Visit: Payer: Self-pay

## 2019-01-22 ENCOUNTER — Ambulatory Visit: Payer: Self-pay | Attending: Nurse Practitioner | Admitting: Nurse Practitioner

## 2019-06-20 IMAGING — MG DIGITAL SCREENING BILATERAL MAMMOGRAM WITH TOMO AND CAD
6 of 12 series · 6 of 36 positions shown · non-contrast
Comparison: Previous exam(s).

CLINICAL DATA: Screening.

EXAM:
DIGITAL SCREENING BILATERAL MAMMOGRAM WITH TOMO AND CAD

[L MLO synth-2D]
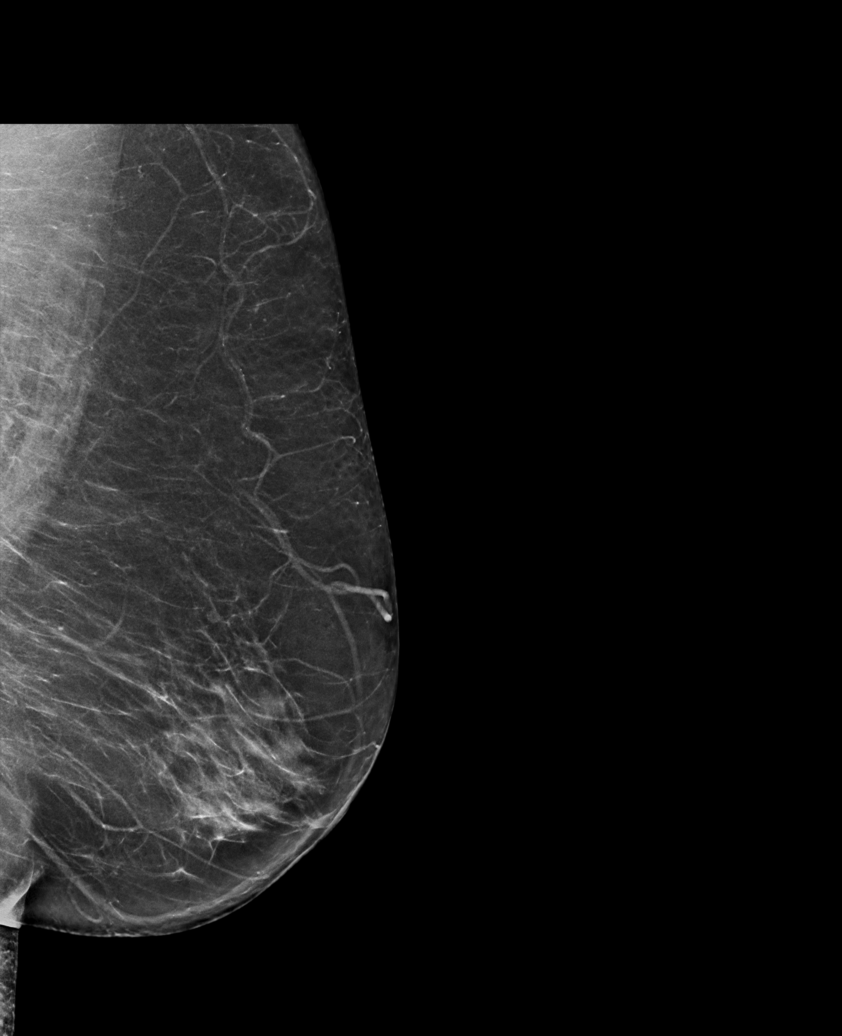

[R CC synth-2D (1 of 2)]
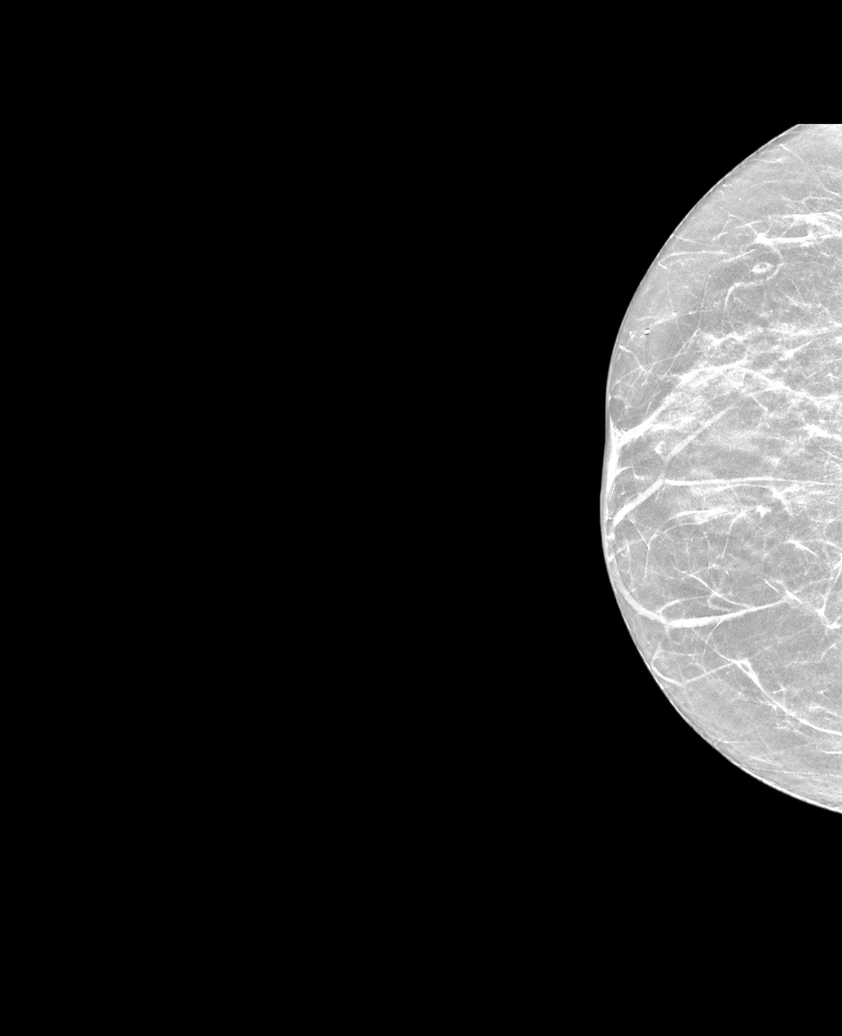

[R CC synth-2D (2 of 2)]
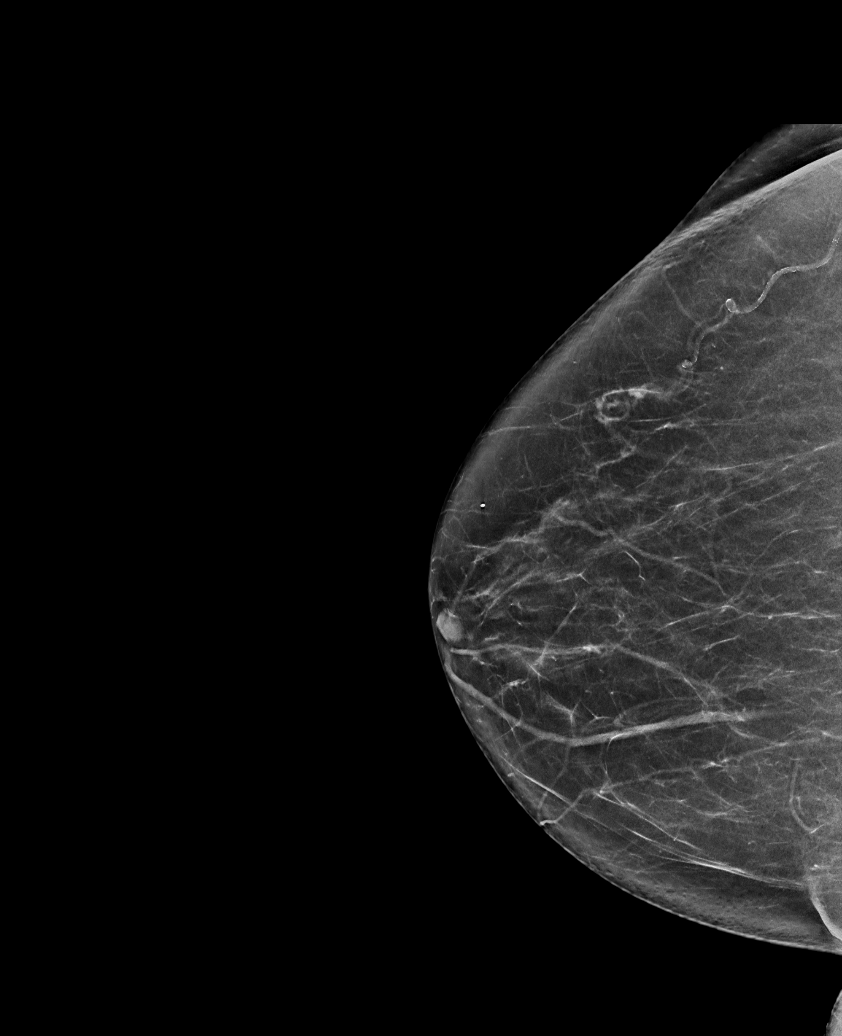

[R MLO synth-2D]
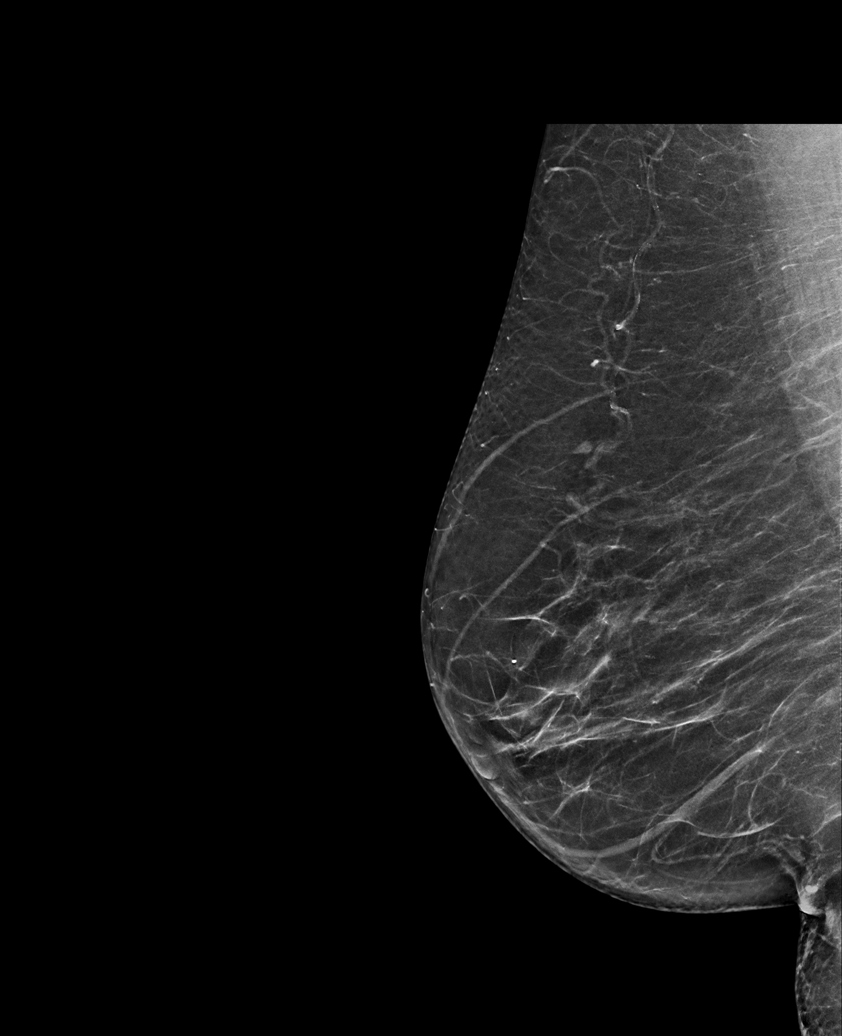

[L CC synth-2D (1 of 2)]
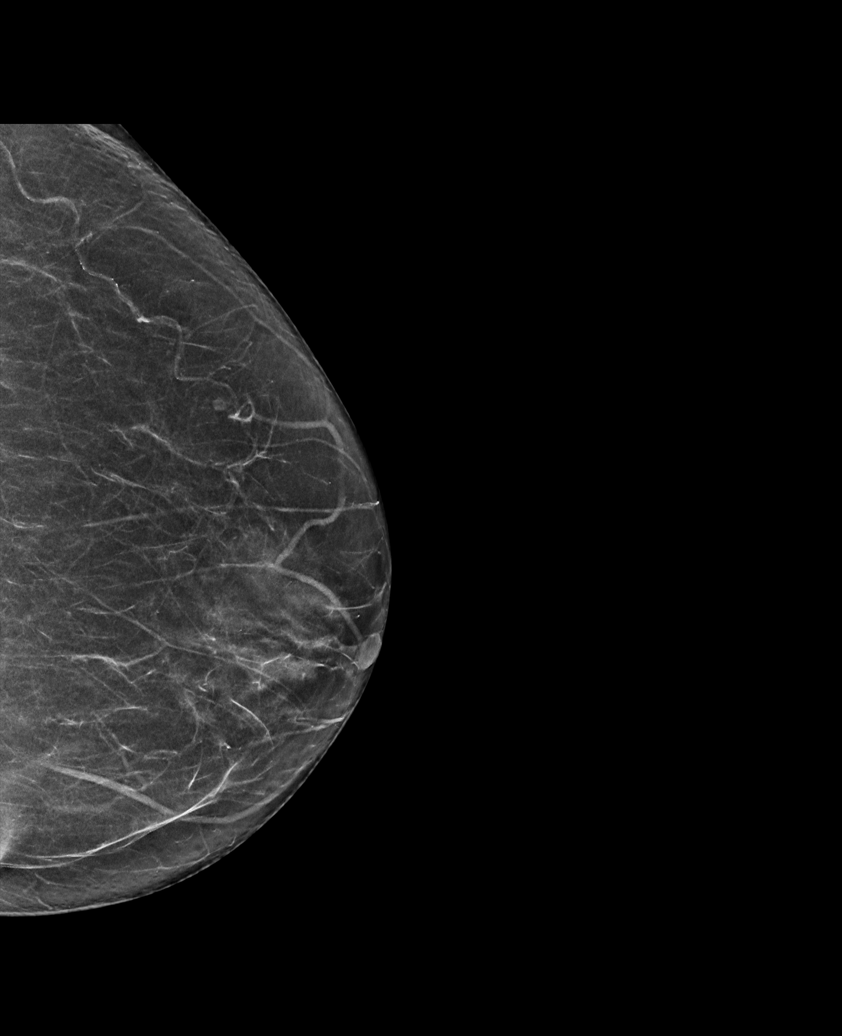

[L CC synth-2D (2 of 2)]
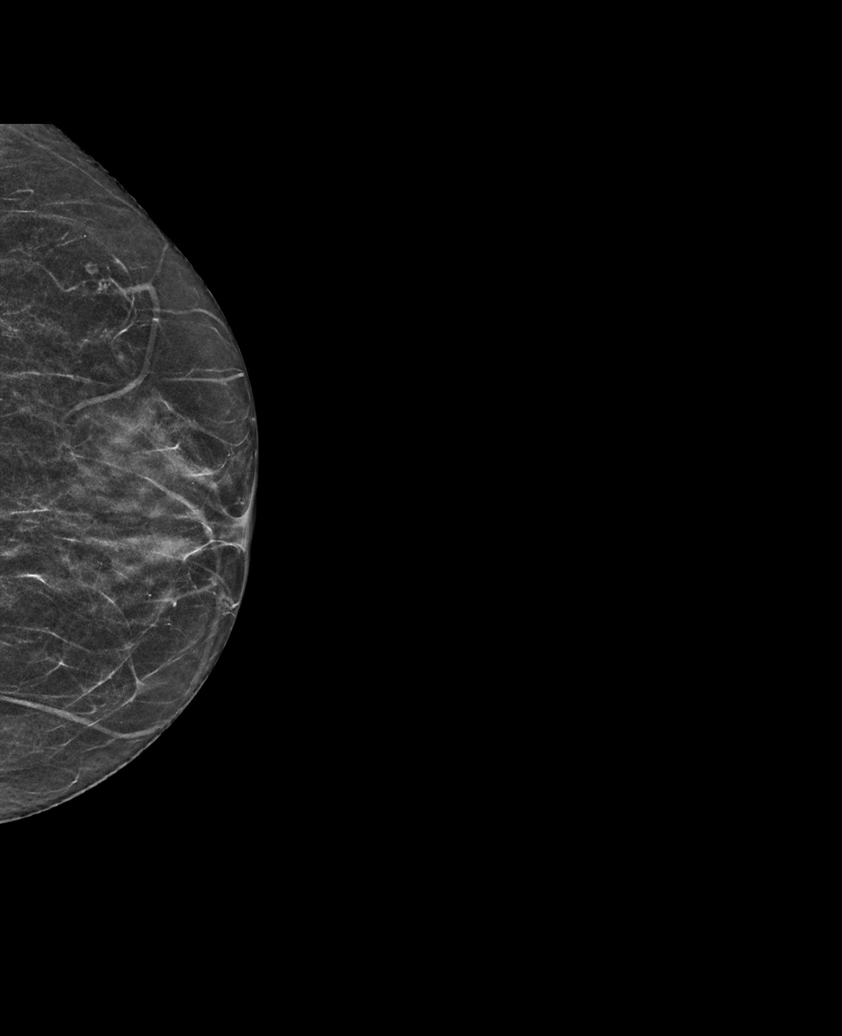

[6 of 36 positions shown; findings below may reference images not displayed]

ACR Breast Density Category c: The breast tissue is heterogeneously
dense, which may obscure small masses.
FINDINGS: There are no findings suspicious for malignancy. Images were
processed with CAD.
IMPRESSION: No mammographic evidence of malignancy. A result letter of this
screening mammogram will be mailed directly to the patient.

RECOMMENDATION:
Screening mammogram in one year. (Code:FT-U-LHB)

BI-RADS CATEGORY  1: Negative.

## 2021-11-18 ENCOUNTER — Encounter: Payer: Self-pay | Admitting: Internal Medicine
